# Patient Record
Sex: Female | Born: 1965 | Race: White | Hispanic: No | Marital: Single | Smoking: Never smoker
Health system: Southern US, Community
[De-identification: ages and names within clinical notes are randomized; demographics above are authoritative.]

## PROBLEM LIST (undated history)

## (undated) DIAGNOSIS — Z87442 Personal history of urinary calculi: Secondary | ICD-10-CM

## (undated) DIAGNOSIS — R29898 Other symptoms and signs involving the musculoskeletal system: Secondary | ICD-10-CM

## (undated) DIAGNOSIS — N289 Disorder of kidney and ureter, unspecified: Secondary | ICD-10-CM

## (undated) HISTORY — PX: OTHER SURGICAL HISTORY: SHX169

## (undated) HISTORY — PX: CYSTOSCOPY/RETROGRADE/URETEROSCOPY/STONE EXTRACTION WITH BASKET: SHX5317

---

## 1998-05-10 ENCOUNTER — Other Ambulatory Visit: Admission: RE | Admit: 1998-05-10 | Discharge: 1998-05-10 | Payer: Self-pay | Admitting: Obstetrics and Gynecology

## 1999-06-20 ENCOUNTER — Other Ambulatory Visit: Admission: RE | Admit: 1999-06-20 | Discharge: 1999-06-20 | Payer: Self-pay | Admitting: Obstetrics and Gynecology

## 2000-05-21 ENCOUNTER — Inpatient Hospital Stay (HOSPITAL_COMMUNITY): Admission: AD | Admit: 2000-05-21 | Discharge: 2000-05-21 | Payer: Self-pay | Admitting: Obstetrics and Gynecology

## 2000-05-24 ENCOUNTER — Inpatient Hospital Stay (HOSPITAL_COMMUNITY): Admission: AD | Admit: 2000-05-24 | Discharge: 2000-05-24 | Payer: Self-pay | Admitting: Obstetrics & Gynecology

## 2000-05-25 ENCOUNTER — Inpatient Hospital Stay (HOSPITAL_COMMUNITY): Admission: AD | Admit: 2000-05-25 | Discharge: 2000-05-27 | Payer: Self-pay | Admitting: Obstetrics and Gynecology

## 2000-06-25 ENCOUNTER — Other Ambulatory Visit: Admission: RE | Admit: 2000-06-25 | Discharge: 2000-06-25 | Payer: Self-pay | Admitting: Obstetrics and Gynecology

## 2001-09-05 ENCOUNTER — Other Ambulatory Visit: Admission: RE | Admit: 2001-09-05 | Discharge: 2001-09-05 | Payer: Self-pay | Admitting: Obstetrics and Gynecology

## 2002-09-10 ENCOUNTER — Other Ambulatory Visit: Admission: RE | Admit: 2002-09-10 | Discharge: 2002-09-10 | Payer: Self-pay | Admitting: Obstetrics and Gynecology

## 2003-10-20 ENCOUNTER — Other Ambulatory Visit: Admission: RE | Admit: 2003-10-20 | Discharge: 2003-10-20 | Payer: Self-pay | Admitting: Obstetrics and Gynecology

## 2004-11-09 ENCOUNTER — Other Ambulatory Visit: Admission: RE | Admit: 2004-11-09 | Discharge: 2004-11-09 | Payer: Self-pay | Admitting: Obstetrics and Gynecology

## 2006-02-15 ENCOUNTER — Ambulatory Visit (HOSPITAL_COMMUNITY): Admission: RE | Admit: 2006-02-15 | Discharge: 2006-02-15 | Payer: Self-pay | Admitting: Obstetrics and Gynecology

## 2007-02-18 ENCOUNTER — Ambulatory Visit (HOSPITAL_COMMUNITY): Admission: RE | Admit: 2007-02-18 | Discharge: 2007-02-18 | Payer: Self-pay | Admitting: Obstetrics and Gynecology

## 2008-02-19 ENCOUNTER — Ambulatory Visit (HOSPITAL_COMMUNITY): Admission: RE | Admit: 2008-02-19 | Discharge: 2008-02-19 | Payer: Self-pay | Admitting: Obstetrics and Gynecology

## 2008-04-10 ENCOUNTER — Encounter (INDEPENDENT_AMBULATORY_CARE_PROVIDER_SITE_OTHER): Payer: Self-pay | Admitting: Obstetrics and Gynecology

## 2008-04-10 ENCOUNTER — Ambulatory Visit (HOSPITAL_COMMUNITY): Admission: RE | Admit: 2008-04-10 | Discharge: 2008-04-10 | Payer: Self-pay | Admitting: Obstetrics and Gynecology

## 2008-11-07 ENCOUNTER — Emergency Department (HOSPITAL_COMMUNITY): Admission: EM | Admit: 2008-11-07 | Discharge: 2008-11-07 | Payer: Self-pay | Admitting: Emergency Medicine

## 2008-11-17 ENCOUNTER — Ambulatory Visit (HOSPITAL_COMMUNITY): Admission: RE | Admit: 2008-11-17 | Discharge: 2008-11-17 | Payer: Self-pay | Admitting: Urology

## 2009-03-08 ENCOUNTER — Ambulatory Visit (HOSPITAL_COMMUNITY): Admission: RE | Admit: 2009-03-08 | Discharge: 2009-03-08 | Payer: Self-pay | Admitting: Pediatrics

## 2009-06-07 ENCOUNTER — Ambulatory Visit (HOSPITAL_COMMUNITY): Admission: RE | Admit: 2009-06-07 | Discharge: 2009-06-07 | Payer: Self-pay | Admitting: Obstetrics and Gynecology

## 2009-07-12 ENCOUNTER — Ambulatory Visit (HOSPITAL_COMMUNITY)
Admission: RE | Admit: 2009-07-12 | Discharge: 2009-07-12 | Payer: Self-pay | Source: Home / Self Care | Admitting: Obstetrics and Gynecology

## 2010-01-31 ENCOUNTER — Ambulatory Visit (HOSPITAL_COMMUNITY)
Admission: RE | Admit: 2010-01-31 | Discharge: 2010-01-31 | Payer: Self-pay | Source: Home / Self Care | Attending: Pediatrics | Admitting: Pediatrics

## 2010-03-14 ENCOUNTER — Ambulatory Visit (HOSPITAL_COMMUNITY)
Admission: RE | Admit: 2010-03-14 | Discharge: 2010-03-14 | Payer: Self-pay | Source: Home / Self Care | Attending: Obstetrics and Gynecology | Admitting: Obstetrics and Gynecology

## 2010-04-11 ENCOUNTER — Other Ambulatory Visit: Payer: Self-pay | Admitting: Gastroenterology

## 2010-05-27 LAB — HCG, QUANTITATIVE, PREGNANCY: hCG, Beta Chain, Quant, S: <2 m[IU]/mL (ref <5)

## 2010-05-27 LAB — BASIC METABOLIC PANEL
BUN: 11 mg/dL (ref 6–23)
Chloride: 102 mEq/L (ref 96–112)
Potassium: 4.4 mEq/L (ref 3.5–5.1)

## 2010-05-27 LAB — HEMOGLOBIN AND HEMATOCRIT, BLOOD: Hemoglobin: 12.7 g/dL (ref 12.0–15.0)

## 2010-06-07 LAB — CBC
HCT: 37.3 % (ref 36.0–46.0)
Hemoglobin: 12.4 g/dL (ref 12.0–15.0)
MCHC: 33.3 g/dL (ref 30.0–36.0)
Platelets: 240 10*3/uL (ref 150–400)
RDW: 13.1 % (ref 11.5–15.5)

## 2010-06-19 ENCOUNTER — Emergency Department (HOSPITAL_COMMUNITY): Payer: BC Managed Care – PPO

## 2010-06-19 ENCOUNTER — Emergency Department (HOSPITAL_COMMUNITY)
Admission: EM | Admit: 2010-06-19 | Discharge: 2010-06-19 | Disposition: A | Discharge status: Home / Self Care | Payer: BC Managed Care – PPO | Attending: Emergency Medicine | Admitting: Emergency Medicine

## 2010-06-19 DIAGNOSIS — R109 Unspecified abdominal pain: Secondary | ICD-10-CM | POA: Insufficient documentation

## 2010-06-19 DIAGNOSIS — N83209 Unspecified ovarian cyst, unspecified side: Secondary | ICD-10-CM | POA: Insufficient documentation

## 2010-06-19 LAB — URINALYSIS, ROUTINE W REFLEX MICROSCOPIC
Glucose, UA: NEGATIVE mg/dL
Ketones, ur: 15 mg/dL — AB
Protein, ur: 30 mg/dL — AB
pH: 5.5 (ref 5.0–8.0)

## 2010-06-19 LAB — URINE MICROSCOPIC-ADD ON

## 2010-06-21 LAB — URINE CULTURE: Culture  Setup Time: 201204292050

## 2010-07-05 NOTE — H&P (Signed)
Tracy Serrano, Tracy Serrano              ACCOUNT NO.:  [id number]   MEDICAL RECORD NO.:  [id number]          PATIENT TYPE:  AMB   LOCATION:  SDC                           FACILITY:  [facility]   PHYSICIAN:  Juluis Mire, M.D.   DATE OF BIRTH:  1965-10-31   DATE OF ADMISSION:  04/10/2008  DATE OF DISCHARGE:                              HISTORY & PHYSICAL   The patient is a 45 year old gravida 4, para 3, aborts 1 female presents  for hysteroscopy.   In relation to present admission, the patient has a history of  menorrhagia while on birth control pills.  Subsequent saline-infused  ultrasound did reveal multiple fibroids, two of which were definitely  submucosal intracavitary measuring 2.4 x 1.8 cm.  She now presents for  hysteroscopic resection.   In terms of allergies, she has seasonal allergies.   Medications include birth control pills in the form of generic Lo/Ovral.  She uses Zyrtec as needed.   PAST MEDICAL HISTORY:  Usual childhood diseases.  No significant  sequelae.   PAST SURGICAL HISTORY:  She has had vaginal hematoma that was in incised  and drained in 1995.  Otherwise, no significant issues.   SURGICAL HISTORY:  She has had three vaginal deliveries and one  miscarriage.   SOCIAL HISTORY:  No tobacco or alcohol use.   FAMILY HISTORY:  Noncontributory.   REVIEW OF SYSTEMS:  Noncontributory.   PHYSICAL EXAMINATION:  VITAL SIGNS:  The patient is afebrile.  Stable  vital signs.  HEENT:  The patient is normocephalic.  Pupils are equal, round, and  reactive to light and accommodation.  Extraocular movements were intact.  Sclerae and conjunctivae are clear.  Oropharynx clear.  NECK:  Without thyromegaly.  BREASTS:  Not examined.  LUNGS:  Clear.  CARDIOVASCULAR:  Regular rate without murmurs or gallops.  ABDOMEN:  Benign.  No mass, organomegaly, or tenderness.  PELVIC:  Normal external genitalia.  Vaginal mucosa is clear.  Cervix  unremarkable.  Uterus upper limits of normal  size and irregular  consistent with known fibroids.  Adnexa unremarkable.  EXTREMITIES:  Trace edema.  NEUROLOGIC:  Grossly within normal limits.   IMPRESSION:  Menorrhagia, on pills with intracavitary fibroids.   PLAN:  The patient to undergo hysteroscopic evaluation with attempt to  resection of fibroids.  Risks have been discussed including the risk of  infection.  The risk of hemorrhage could require transfusion with the  risk of AIDS or hepatitis.  Excessive bleeding could require  hysterectomy.  There is a risk of perforation leading to injury to  adjacent organs requiring exploratory surgery.  Risk of deep venous  thrombosis and pulmonary embolus.  Rare complications include fluid  overload leading to pulmonary edema and/or hyponatremia.  The patient  does understand indications and risks.      Juluis Mire, M.D.  Electronically Signed     JSM/MEDQ  D:  04/10/2008  T:  04/10/2008  Job:  [id number]

## 2010-07-05 NOTE — Op Note (Signed)
Tracy Serrano, Tracy Serrano              ACCOUNT NO.:  [id number]   MEDICAL RECORD NO.:  [id number]          PATIENT TYPE:  AMB   LOCATION:  SDC                           FACILITY:  [facility]   PHYSICIAN:  Juluis Mire, M.D.   DATE OF BIRTH:  1965/11/10   DATE OF PROCEDURE:  04/10/2008  DATE OF DISCHARGE:                               OPERATIVE REPORT   PREOPERATIVE DIAGNOSIS:  Uterine fibroids.   POSTOPERATIVE DIAGNOSIS:  Uterine fibroids.   PROCEDURE:  Hysteroscopy, resection of submucosal fibroids.   SURGEON:  Juluis Mire, MD.   ANESTHESIA:  General.   ESTIMATED BLOOD LOSS:  Minimal.   PACKS AND DRAINS:  None.   INTRAOPERATIVE BLOOD PLACED:  None.   COMPLICATIONS:  None.   INDICATIONS:  As dictated in the history and physical.   PROCEDURE IN DETAIL:  The patient was taken to the OR and placed in  supine position.  After satisfactory level of general anesthesia was  obtained, the patient was placed in the dorsal lithotomy position using  Allen stirrups.  The perineum, vagina prepped out with Betadine and  draped sterile field.  A speculum was placed in the vaginal vault.  Cervix was grasped with single-tooth tenaculum.  A paracervical block  using 1% Nesacaine was instituted.  Uterus sounded to 10 cm.  Cervix was  dilated to a size 31 Pratt dilator.  Operative hysteroscopy was then  introduced.  Intrauterine cavity was distended using sorbitol.  Visualization revealed a intrauterine fibroid on the right pelvic  sidewall then posteriorly she had a submucosal fibroid.  Remaining  endometrial cavity appeared unremarkable.  First we resected out the  intrauterine fibroid to the right side.  Pieces removed using suction  curetting.  We then began resecting the posterior wall fibroid.  Again,  used suction curette to remove the pieces of the fibroid.  This was  continued until we were flush with the uterine cavity.  At this point,  it did not seem like the fibroid is protruding  out.  We are afraid to go  any deeper and our deficit was over 500 mL at this point in time.  We  had no active bleeding.  The hysteroscope was removed.  We did do intrauterine curettings.  The  single-tooth tenaculum and speculum then removed.  The patient taken out  in the dorsal lithotomy position, was alert, transferred to recovery in  good condition.  Sponge, instrument, and needle count was correct by  circulating nurse.      Juluis Mire, M.D.  Electronically Signed     JSM/MEDQ  D:  04/10/2008  T:  04/10/2008  Job:  [id number]

## 2010-11-26 IMAGING — CT CT ABDOMEN W/O CM
4 of 5 series · 12 of 46 positions shown, 17 images · non-contrast
Comparison: None

CT ABDOMEN

CLINICAL DATA: Left flank pain; evaluate for obstructing stone.

CT ABDOMEN AND PELVIS WITHOUT CONTRAST
TECHNIQUE: Multidetector CT imaging of the abdomen and pelvis was
performed using the standard protocol without use of intravenous
contrast.

[Series 3: lung 5.0 b60f · axial · 0.71mm/px · z∈[-188,-153]mm · 2 of 40 slices shown]
[im 7/40  bone]
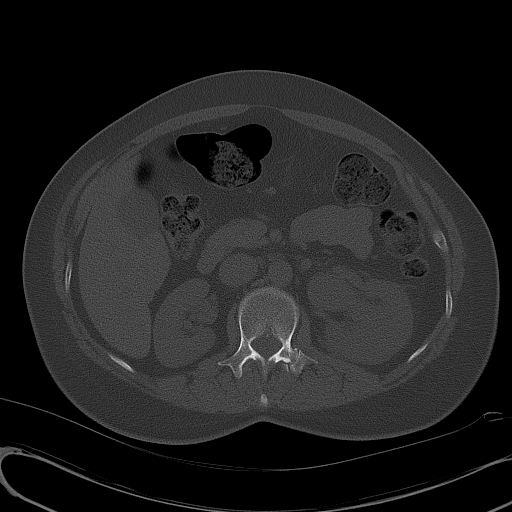
[im 14/40  bone]
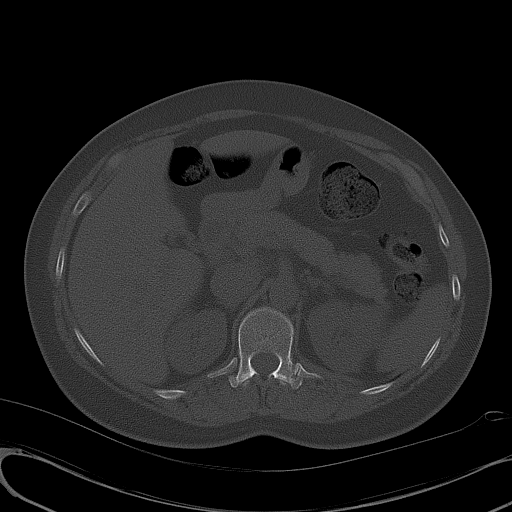

[Series 4: mpr coronal (id) · coronal · 0.77mm/px · 3 of 88 slices shown]
[im 30/88  soft-tissue]
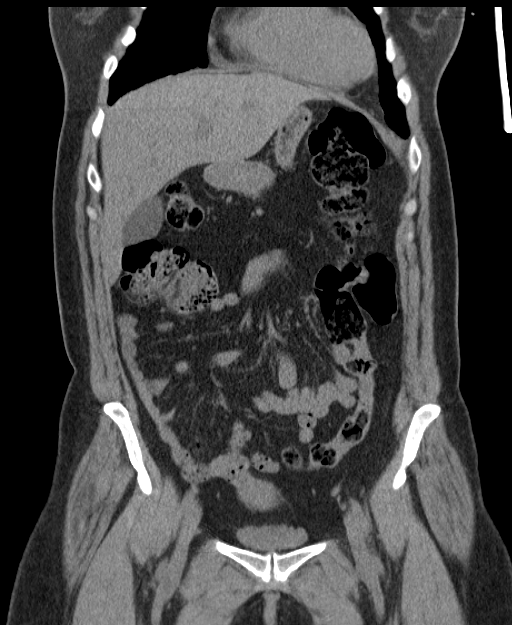
[im 39/88  soft-tissue]
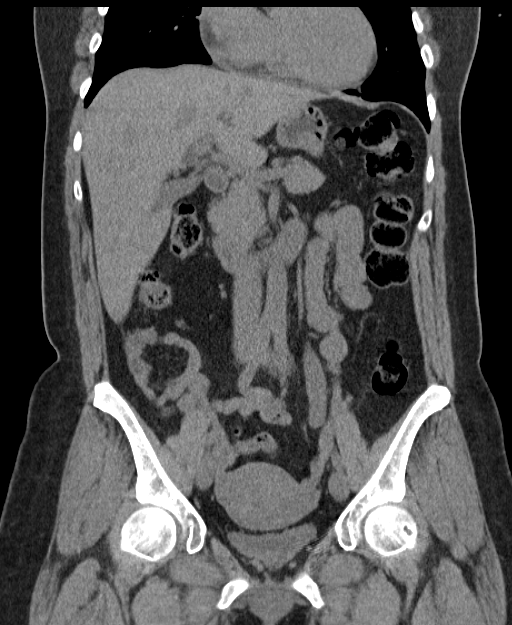
[im 49/88  soft-tissue]
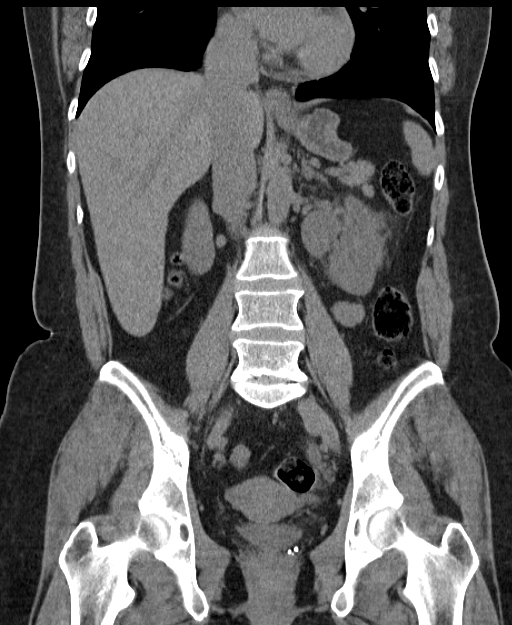

[Series 5: mpr sagittal (id) · sagittal · 0.63mm/px · 1 of 137 slices shown]
[im 46/137  soft-tissue]
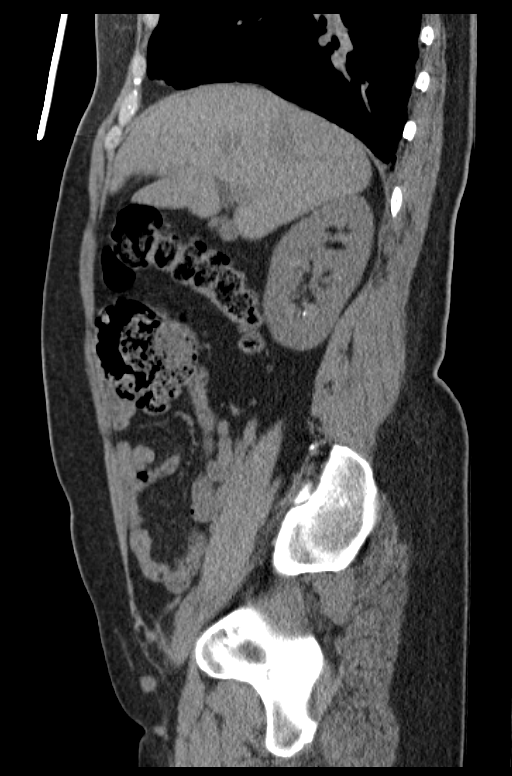

[Series 101: tmp stone rtd · axial · 0.71mm/px · z∈[-438,-93]mm · 6 of 42 slices shown, 11 images]
[im 6/42  soft-tissue]
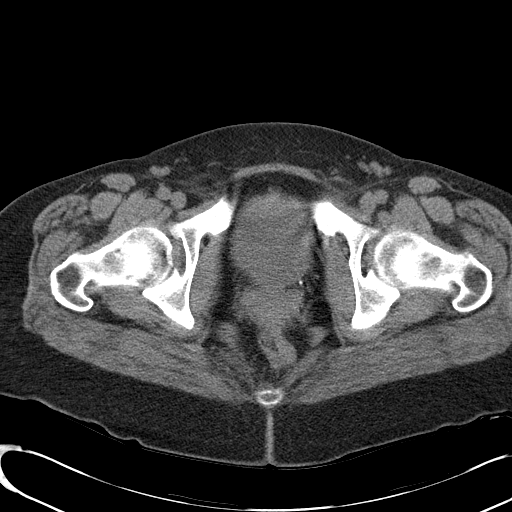
[im 6/42  bone]
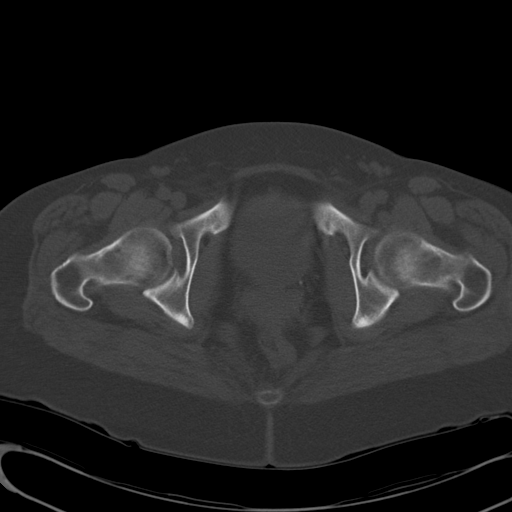
[im 12/42  soft-tissue]
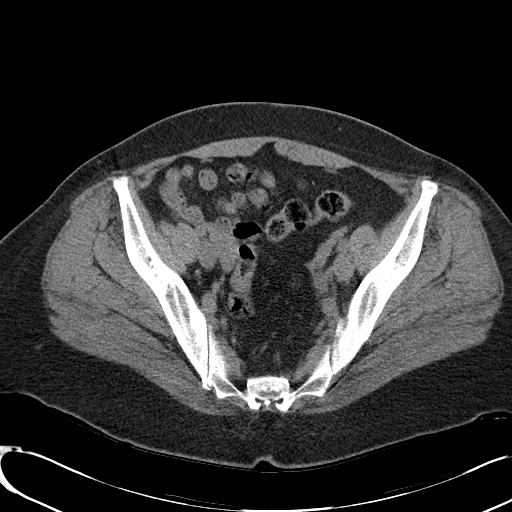
[im 18/42  soft-tissue]
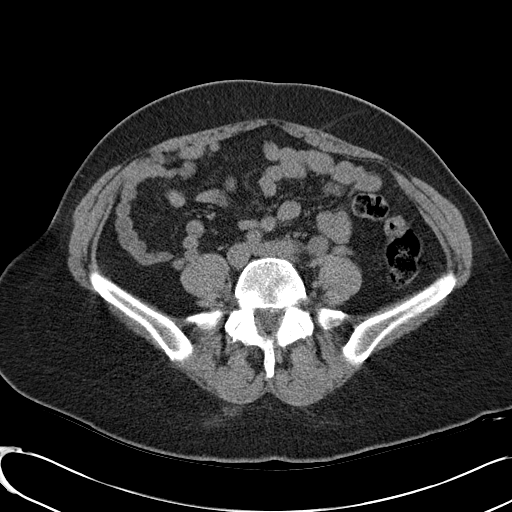
[im 18/42  lung]
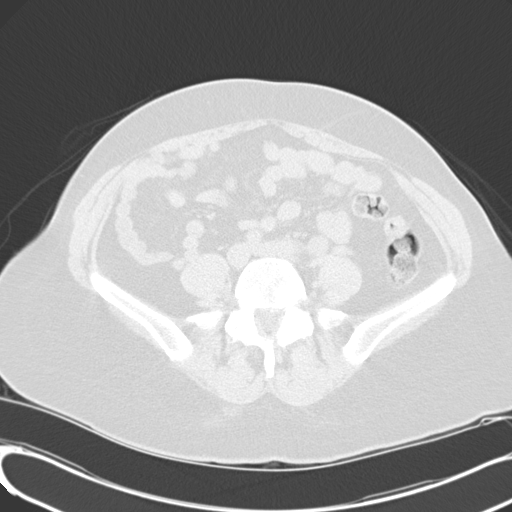
[im 24/42  soft-tissue]
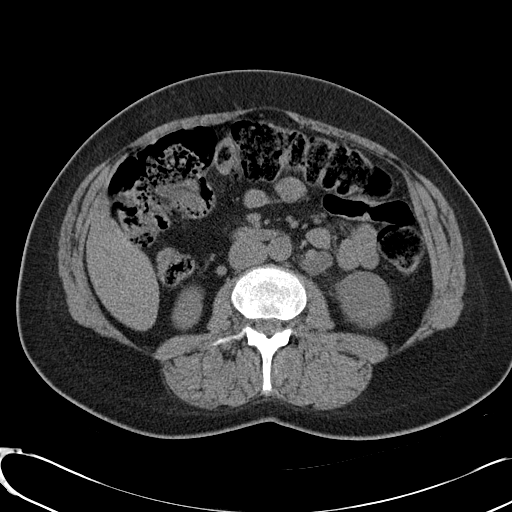
[im 24/42  lung]
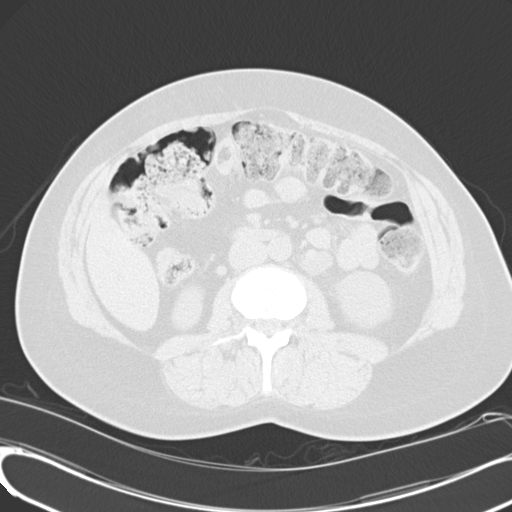
[im 30/42  soft-tissue]
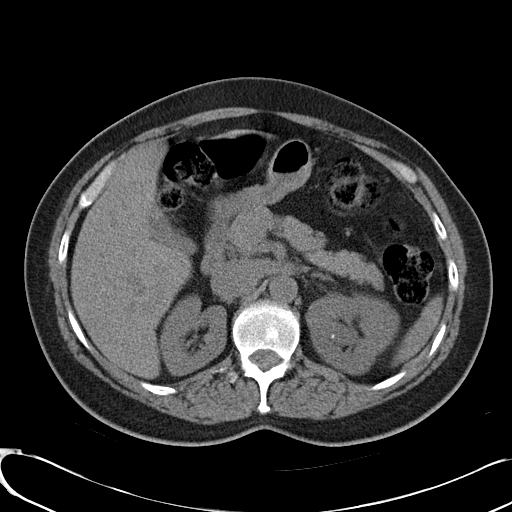
[im 30/42  lung]
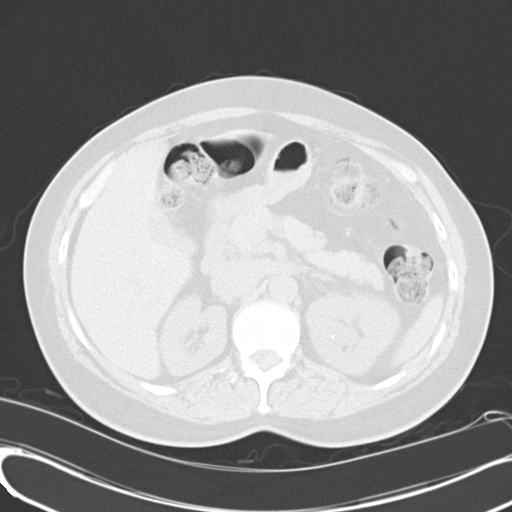
[im 36/42  soft-tissue]
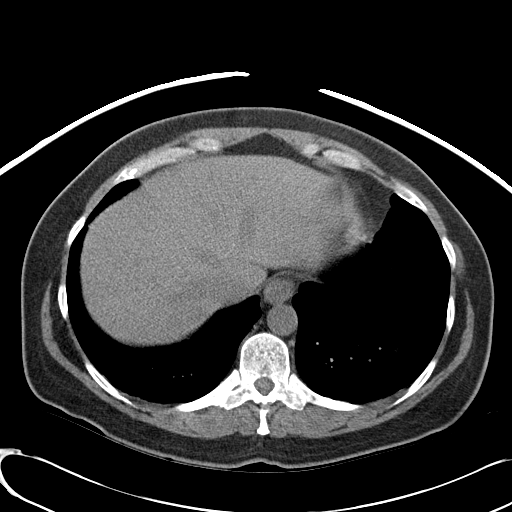
[im 36/42  lung]
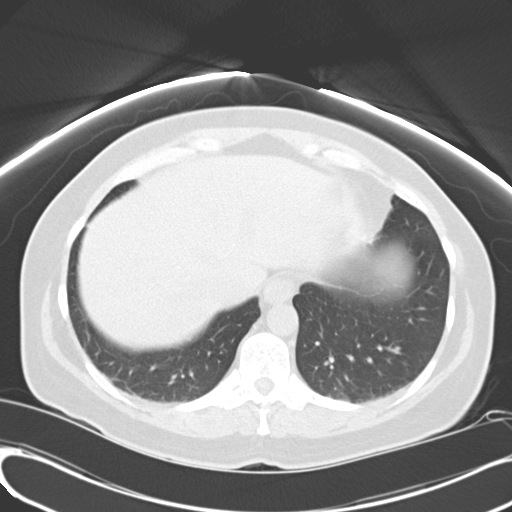

[12 of 46 positions shown; findings below may reference images not displayed]

FINDINGS: Minimal bibasilar atelectasis is noted.

The liver and spleen are unremarkable in appearance.  The
gallbladder is within normal limits.  The pancreas and adrenal
glands are unremarkable.

There is moderate left-sided hydronephrosis, with significant
enlargement of the left ureter to the level of an obstructing 7 x 4
mm stone within the distal left ureter, approximately 5 cm above
the left vesicoureteral junction.  Mild left-sided perinephric
stranding is noted.

Three additional small nonobstructing stones are noted within the
left kidney, the largest of which measures 3 mm in size.  There is
also a 4 mm nonobstructing stone identified at the inferior pole of
the right kidney.  The right kidney is otherwise unremarkable in
appearance.

No free fluid is identified.  The small bowel is unremarkable in
appearance.  The stomach is within normal limits.  No acute
vascular abnormalities are seen.

No acute osseous abnormalities are identified. There is incomplete
fusion of the transverse processes of L1, and incomplete fusion of
the left transverse process of L2.
IMPRESSION: 1.  Moderate left-sided hydroureteronephrosis, to the level of a 7
x 4 mm obstructing stone in the distal left ureter, approximately 5
cm above the left vesicoureteral junction.
2.  Additional nonobstructing stones noted within both kidneys.

CT PELVIS
FINDINGS: No free fluid is identified within the pelvis.  There is
a relatively mobile cecum, which is seen flipped anteriorly
adjacent to the inferior tip of the liver.  The colon is otherwise
unremarkable in appearance.  The appendix is not definitely seen.

The bladder is largely decompressed and within normal limits. The
uterus is unremarkable in appearance.  The adnexa are within normal
limits.  Scattered vascular calcifications are seen about the
cervix.  There is no evidence of inguinal lymphadenopathy.

No acute osseous abnormalities are seen. A small herniation pit is
noted at the anterior left femoral neck.
IMPRESSION: No acute abnormalities identified within the pelvis; relatively
mobile cecum noted.

## 2011-02-17 ENCOUNTER — Other Ambulatory Visit (HOSPITAL_COMMUNITY): Payer: Self-pay | Admitting: Obstetrics and Gynecology

## 2011-02-17 DIAGNOSIS — Z139 Encounter for screening, unspecified: Secondary | ICD-10-CM

## 2011-03-20 ENCOUNTER — Ambulatory Visit (HOSPITAL_COMMUNITY)
Admission: RE | Admit: 2011-03-20 | Discharge: 2011-03-20 | Disposition: A | Discharge status: Home / Self Care | Payer: BC Managed Care – PPO | Source: Ambulatory Visit | Attending: Obstetrics and Gynecology | Admitting: Obstetrics and Gynecology

## 2011-03-20 DIAGNOSIS — Z139 Encounter for screening, unspecified: Secondary | ICD-10-CM

## 2011-03-20 DIAGNOSIS — Z1231 Encounter for screening mammogram for malignant neoplasm of breast: Secondary | ICD-10-CM | POA: Insufficient documentation

## 2012-03-05 ENCOUNTER — Other Ambulatory Visit (HOSPITAL_COMMUNITY): Payer: Self-pay | Admitting: Obstetrics and Gynecology

## 2012-03-05 DIAGNOSIS — IMO0001 Reserved for inherently not codable concepts without codable children: Secondary | ICD-10-CM

## 2012-03-25 ENCOUNTER — Ambulatory Visit (HOSPITAL_COMMUNITY)
Admission: RE | Admit: 2012-03-25 | Discharge: 2012-03-25 | Disposition: A | Discharge status: Home / Self Care | Payer: BC Managed Care – PPO | Source: Ambulatory Visit | Attending: Obstetrics and Gynecology | Admitting: Obstetrics and Gynecology

## 2012-03-25 DIAGNOSIS — Z1231 Encounter for screening mammogram for malignant neoplasm of breast: Secondary | ICD-10-CM | POA: Insufficient documentation

## 2012-03-25 DIAGNOSIS — IMO0001 Reserved for inherently not codable concepts without codable children: Secondary | ICD-10-CM

## 2013-03-10 ENCOUNTER — Other Ambulatory Visit (HOSPITAL_COMMUNITY): Payer: Self-pay | Admitting: Obstetrics and Gynecology

## 2013-03-10 DIAGNOSIS — Z139 Encounter for screening, unspecified: Secondary | ICD-10-CM

## 2013-03-27 ENCOUNTER — Encounter (HOSPITAL_COMMUNITY): Payer: Self-pay | Admitting: Emergency Medicine

## 2013-03-27 ENCOUNTER — Emergency Department (HOSPITAL_COMMUNITY)
Admission: EM | Admit: 2013-03-27 | Discharge: 2013-03-27 | Disposition: A | Discharge status: Home / Self Care | Payer: BC Managed Care – PPO | Attending: Emergency Medicine | Admitting: Emergency Medicine

## 2013-03-27 DIAGNOSIS — X503XXA Overexertion from repetitive movements, initial encounter: Secondary | ICD-10-CM | POA: Insufficient documentation

## 2013-03-27 DIAGNOSIS — S43499A Other sprain of unspecified shoulder joint, initial encounter: Secondary | ICD-10-CM | POA: Insufficient documentation

## 2013-03-27 DIAGNOSIS — M79602 Pain in left arm: Secondary | ICD-10-CM

## 2013-03-27 DIAGNOSIS — Z87442 Personal history of urinary calculi: Secondary | ICD-10-CM | POA: Insufficient documentation

## 2013-03-27 DIAGNOSIS — Y929 Unspecified place or not applicable: Secondary | ICD-10-CM | POA: Insufficient documentation

## 2013-03-27 DIAGNOSIS — S46819A Strain of other muscles, fascia and tendons at shoulder and upper arm level, unspecified arm, initial encounter: Principal | ICD-10-CM

## 2013-03-27 DIAGNOSIS — Y9389 Activity, other specified: Secondary | ICD-10-CM | POA: Insufficient documentation

## 2013-03-27 DIAGNOSIS — T148XXA Other injury of unspecified body region, initial encounter: Secondary | ICD-10-CM

## 2013-03-27 HISTORY — DX: Disorder of kidney and ureter, unspecified: N28.9

## 2013-03-27 MED ORDER — CYCLOBENZAPRINE HCL 10 MG PO TABS: 10.0000 mg | ORAL_TABLET | Freq: Two times a day (BID) | ORAL | Status: DC | PRN

## 2013-03-27 MED ORDER — TRAMADOL HCL 50 MG PO TABS: 50.0000 mg | ORAL_TABLET | Freq: Four times a day (QID) | ORAL | Status: DC | PRN

## 2013-03-27 NOTE — ED Notes (Signed)
Pt c/o pain to left arm. Pain began after carrying wood ~ 54200ft. Pt states pain is much worse with movement.

## 2013-03-27 NOTE — ED Provider Notes (Signed)
Medical screening examination/treatment/procedure(s) were performed by non-physician practitioner and as supervising physician I was immediately available for consultation/collaboration.  EKG Interpretation   None         Desirey Keahey W Shiela Bruns, MD 03/27/13 1644 

## 2013-03-27 NOTE — ED Provider Notes (Signed)
CSN: [id number]     Arrival date & time 03/27/13  0915 History   First MD Initiated Contact with Patient 03/27/13 1000     Chief Complaint  Patient presents with  . Arm Injury   (Consider location/radiation/quality/duration/timing/severity/associated sxs/prior Treatment) HPI Comments: Patient is a 48 year old female who presents to the emergency department complaining of left arm pain x5 days. Patient states she was carrying wood about 500 feet, the next day she began to feel pain in her left deltoid radiating down her arm. Pain worse with movement. She tried taking hydrocodone, codeine and tramadol which her husband that with some relief. Denies numbness.  Patient is a 48 y.o. female presenting with arm injury. The history is provided by the patient.  Arm Injury Associated symptoms: no fever     Past Medical History  Diagnosis Date  . Renal disorder     kideny stones   Past Surgical History  Procedure Laterality Date  . Basket recovery     No family history on file. History  Substance Use Topics  . Smoking status: Never Smoker   . Smokeless tobacco: Not on file  . Alcohol Use: No   OB History   Grav Para Term Preterm Abortions TAB SAB Ect Mult Living                 Review of Systems  Constitutional: Negative for fever.  Respiratory: Negative.   Cardiovascular: Negative.   Gastrointestinal: Negative for nausea.  Musculoskeletal:       Positive for left arm pain.  Neurological: Negative for numbness.    Allergies  Review of patient's allergies indicates no known allergies.  Home Medications   Current Outpatient Rx  Name  Route  Sig  Dispense  Refill  . cyclobenzaprine (FLEXERIL) 10 MG tablet   Oral   Take 1 tablet (10 mg total) by mouth 2 (two) times daily as needed for muscle spasms.   20 tablet   0   . traMADol (ULTRAM) 50 MG tablet   Oral   Take 1 tablet (50 mg total) by mouth every 6 (six) hours as needed.   15 tablet   0    BP 122/79  Pulse 70   Temp(Src) 98.5 F (36.9 C) (Oral)  Resp 16  Ht 5\' 7"  (1.702 m)  Wt 156 lb (70.761 kg)  BMI 24.43 kg/m2  SpO2 100%  LMP 03/11/2013 Physical Exam  Nursing note and vitals reviewed. Constitutional: She is oriented to person, place, and time. She appears well-developed and well-nourished. No distress.  HENT:  Head: Normocephalic and atraumatic.  Mouth/Throat: Oropharynx is clear and moist.  Eyes: Conjunctivae are normal.  Neck: Normal range of motion. Neck supple. No spinous process tenderness present.  Cardiovascular: Normal rate, regular rhythm, normal heart sounds and intact distal pulses.   Pulmonary/Chest: Effort normal and breath sounds normal.  Musculoskeletal: Normal range of motion. She exhibits no edema.  Tender to palpation of the left deltoid and trapezius with spasm. Full range of motion of left shoulder and arm. Pain noted with left shoulder flexion and abduction. No bony tenderness.  Neurological: She is alert and oriented to person, place, and time. She has normal strength. No sensory deficit.  Skin: Skin is warm and dry. She is not diaphoretic.  Psychiatric: She has a normal mood and affect. Her behavior is normal.    ED Course  Procedures (including critical care time) Labs Review Labs Reviewed - No data to display Imaging Review No results  found.  EKG Interpretation   None       MDM   1. Muscle strain   2. Arm pain, left    Patient presenting with left arm pain after heavy lifting. She is well appearing and in no apparent distress with normal vital signs. No bony tenderness. Treat with Flexeril and tramadol. Advised heating pad. Stable for discharge. Return precautions given. Patient states understanding of treatment care plan and is agreeable.   Trevor Mace, PA-C 03/27/13 1026

## 2013-03-27 NOTE — Discharge Instructions (Signed)
Take Flexeril as directed for muscle spasm. No driving or operating heavy machinery with taking this drug as it may cause drowsiness. Take tramadol as directed. Rest, avoid heavy lifting or hard physical activity. Apply heating pad intermittently throughout the day.  Muscle Strain A muscle strain is an injury that occurs when a muscle is stretched beyond its normal length. Usually a small number of muscle fibers are torn when this happens. Muscle strain is rated in degrees. First-degree strains have the least amount of muscle fiber tearing and pain. Second-degree and third-degree strains have increasingly more tearing and pain.  Usually, recovery from muscle strain takes 1 2 weeks. Complete healing takes 5 6 weeks.  CAUSES  Muscle strain happens when a sudden, violent force placed on a muscle stretches it too far. This may occur with lifting, sports, or a fall.  RISK FACTORS Muscle strain is especially common in athletes.  SIGNS AND SYMPTOMS At the site of the muscle strain, there may be:  Pain.  Bruising.  Swelling.  Difficulty using the muscle due to pain or lack of normal function. DIAGNOSIS  Your health care provider will perform a physical exam and ask about your medical history. TREATMENT  Often, the best treatment for a muscle strain is resting, icing, and applying cold compresses to the injured area.  HOME CARE INSTRUCTIONS   Use the PRICE method of treatment to promote muscle healing during the first 2 3 days after your injury. The PRICE method involves:  Protecting the muscle from being injured again.  Restricting your activity and resting the injured body part.  Icing your injury. To do this, put ice in a plastic bag. Place a towel between your skin and the bag. Then, apply the ice and leave it on from 15 20 minutes each hour. After the third day, switch to moist heat packs.  Apply compression to the injured area with a splint or elastic bandage. Be careful not to wrap it  too tightly. This may interfere with blood circulation or increase swelling.  Elevate the injured body part above the level of your heart as often as you can.  Only take over-the-counter or prescription medicines for pain, discomfort, or fever as directed by your health care provider.  Warming up prior to exercise helps to prevent future muscle strains. SEEK MEDICAL CARE IF:   You have increasing pain or swelling in the injured area.  You have numbness, tingling, or a significant loss of strength in the injured area. MAKE SURE YOU:   Understand these instructions.  Will watch your condition.  Will get help right away if you are not doing well or get worse. Document Released: 02/06/2005 Document Revised: 11/27/2012 Document Reviewed: 09/05/2012 [facility] Patient Information 2014 [employer], [place].

## 2013-03-30 ENCOUNTER — Encounter (HOSPITAL_COMMUNITY): Payer: Self-pay | Admitting: Emergency Medicine

## 2013-03-30 ENCOUNTER — Emergency Department (HOSPITAL_COMMUNITY)
Admission: EM | Admit: 2013-03-30 | Discharge: 2013-03-30 | Disposition: A | Discharge status: Home / Self Care | Payer: BC Managed Care – PPO | Attending: Emergency Medicine | Admitting: Emergency Medicine

## 2013-03-30 ENCOUNTER — Emergency Department (HOSPITAL_COMMUNITY): Payer: BC Managed Care – PPO

## 2013-03-30 DIAGNOSIS — Z87442 Personal history of urinary calculi: Secondary | ICD-10-CM | POA: Insufficient documentation

## 2013-03-30 DIAGNOSIS — R42 Dizziness and giddiness: Secondary | ICD-10-CM | POA: Insufficient documentation

## 2013-03-30 DIAGNOSIS — M5412 Radiculopathy, cervical region: Secondary | ICD-10-CM | POA: Insufficient documentation

## 2013-03-30 DIAGNOSIS — G8911 Acute pain due to trauma: Secondary | ICD-10-CM | POA: Insufficient documentation

## 2013-03-30 LAB — URINALYSIS, ROUTINE W REFLEX MICROSCOPIC
BILIRUBIN URINE: NEGATIVE
Glucose, UA: NEGATIVE mg/dL
HGB URINE DIPSTICK: NEGATIVE
Ketones, ur: 15 mg/dL — AB
Leukocytes, UA: NEGATIVE
Nitrite: NEGATIVE
Protein, ur: NEGATIVE mg/dL
SPECIFIC GRAVITY, URINE: 1.015 (ref 1.005–1.030)
Urobilinogen, UA: 0.2 mg/dL (ref 0.0–1.0)
pH: 8.5 — ABNORMAL HIGH (ref 5.0–8.0)

## 2013-03-30 MED ORDER — KETOROLAC TROMETHAMINE 60 MG/2ML IM SOLN
60.0000 mg | Freq: Once | INTRAMUSCULAR | Status: AC
  Administered 2013-03-30: 60 mg via INTRAMUSCULAR
  Filled 2013-03-30: qty 2

## 2013-03-30 MED ORDER — DIAZEPAM 2 MG PO TABS
2.0000 mg | ORAL_TABLET | Freq: Once | ORAL | Status: AC
  Administered 2013-03-30: 2 mg via ORAL
  Filled 2013-03-30: qty 1

## 2013-03-30 MED ORDER — HYDROCODONE-ACETAMINOPHEN 5-325 MG PO TABS
2.0000 | ORAL_TABLET | ORAL | Status: DC | PRN
Start: 2013-03-30 — End: 2015-07-22

## 2013-03-30 MED ORDER — METHYLPREDNISOLONE (PAK) 4 MG PO TABS: ORAL_TABLET | ORAL | Status: DC

## 2013-03-30 NOTE — Discharge Instructions (Signed)
Cervical Radiculopathy  Cervical radiculopathy happens when a nerve in the neck is pinched or bruised by a slipped (herniated) disk or by arthritic changes in the bones of the cervical spine. This can occur due to an injury or as part of the normal aging process. Pressure on the cervical nerves can cause pain or numbness that runs from your neck all the way down into your arm and fingers.  CAUSES   There are many possible causes, including:  · Injury.  · Muscle tightness in the neck from overuse.  · Swollen, painful joints (arthritis).  · Breakdown or degeneration in the bones and joints of the spine (spondylosis) due to aging.  · Bone spurs that may develop near the cervical nerves.  SYMPTOMS   Symptoms include pain, weakness, or numbness in the affected arm and hand. Pain can be severe or irritating. Symptoms may be worse when extending or turning the neck.  DIAGNOSIS   Your caregiver will ask about your symptoms and do a physical exam. He or she may test your strength and reflexes. X-rays, CT scans, and MRI scans may be needed in cases of injury or if the symptoms do not go away after a period of time. Electromyography (EMG) or nerve conduction testing may be done to study how your nerves and muscles are working.  TREATMENT   Your caregiver may recommend certain exercises to help relieve your symptoms. Cervical radiculopathy can, and often does, get better with time and treatment. If your problems continue, treatment options may include:  · Wearing a soft collar for short periods of time.  · Physical therapy to strengthen the neck muscles.  · Medicines, such as nonsteroidal anti-inflammatory drugs (NSAIDs), oral corticosteroids, or spinal injections.  · Surgery. Different types of surgery may be done depending on the cause of your problems.  HOME CARE INSTRUCTIONS   · Put ice on the affected area.  · Put ice in a plastic bag.  · Place a towel between your skin and the bag.  · Leave the ice on for 15-20 minutes,  03-04 times a day or as directed by your caregiver.  · If ice does not help, you can try using heat. Take a warm shower or bath, or use a hot water bottle as directed by your caregiver.  · You may try a gentle neck and shoulder massage.  · Use a flat pillow when you sleep.  · Only take over-the-counter or prescription medicines for pain, discomfort, or fever as directed by your caregiver.  · If physical therapy was prescribed, follow your caregiver's directions.  · If a soft collar was prescribed, use it as directed.  SEEK IMMEDIATE MEDICAL CARE IF:   · Your pain gets much worse and cannot be controlled with medicines.  · You have weakness or numbness in your hand, arm, face, or leg.  · You have a high fever or a stiff, rigid neck.  · You lose bowel or bladder control (incontinence).  · You have trouble with walking, balance, or speaking.  MAKE SURE YOU:   · Understand these instructions.  · Will watch your condition.  · Will get help right away if you are not doing well or get worse.  Document Released: 11/01/2000 Document Revised: 05/01/2011 Document Reviewed: 09/20/2010  [employer]® Patient Information ©2014 [employer].

## 2013-03-30 NOTE — ED Provider Notes (Signed)
CSN: [id number]     Arrival date & time 03/30/13  1652 History  This chart was scribed for Glynn Octave, MD by Dorothey Baseman, ED Scribe. This patient was seen in room APA16A/APA16A and the patient's care was started at 5:25 PM.    Chief Complaint  Patient presents with  . Arm Pain   The history is provided by the patient. No language interpreter was used.   HPI Comments: Tracy Serrano is a 48 y.o. female who presents to the Emergency Department complaining of a constant pain, described as "spasms," to the left arm, extending from the shoulder to the mid-forearm, onset about a week ago after some heavy lifting. She states that the pain is exacerbated with movement. Patient was seen here 3 days ago for similar complaints and was discharged with Flexeril and Tramadol, which she states has only been able to provide mild, temporary relief. Patient also reports some dizziness and lightheadedness upon standing and that the dizziness resolved, but that the lightheadedness has been persistent. She reports some associated numbness and paresthesias to all fingers of the left hand, which she states is new for her. She reports applying warm compresses and keeping the area elevated at home without relief. She denies chest pain, shortness of breath. Patient has a history of renal disorder.   Past Medical History  Diagnosis Date  . Renal disorder     kideny stones   Past Surgical History  Procedure Laterality Date  . Basket recovery     No family history on file. History  Substance Use Topics  . Smoking status: Never Smoker   . Smokeless tobacco: Not on file  . Alcohol Use: No   OB History   Grav Para Term Preterm Abortions TAB SAB Ect Mult Living                 Review of Systems  A complete 10 system review of systems was obtained and all systems are negative except as noted in the HPI and PMH.   Allergies  Review of patient's allergies indicates no known allergies.  Home Medications    Current Outpatient Rx  Name  Route  Sig  Dispense  Refill  . cyclobenzaprine (FLEXERIL) 10 MG tablet   Oral   Take 1 tablet (10 mg total) by mouth 2 (two) times daily as needed for muscle spasms.   20 tablet   0   . traMADol (ULTRAM) 50 MG tablet   Oral   Take 50 mg by mouth every 6 (six) hours as needed. pain         . HYDROcodone-acetaminophen (NORCO/VICODIN) 5-325 MG per tablet   Oral   Take 2 tablets by mouth every 4 (four) hours as needed.   10 tablet   0   . methylPREDNIsolone (MEDROL DOSPACK) 4 MG tablet      follow package directions   21 tablet   0    Triage Vitals: BP 113/68  Pulse 81  Temp(Src) 97.9 F (36.6 C) (Oral)  Resp 20  Ht 5\' 7"  (1.702 m)  Wt 156 lb (70.761 kg)  BMI 24.43 kg/m2  SpO2 100%  LMP 03/11/2013  Physical Exam  Nursing note and vitals reviewed. Constitutional: She is oriented to person, place, and time. She appears well-developed and well-nourished. No distress.  HENT:  Head: Normocephalic and atraumatic.  Eyes: Conjunctivae are normal.  Neck: Normal range of motion. Neck supple.  Cardiovascular: Normal rate, regular rhythm, normal heart sounds and intact distal  pulses.   Pulses:      Radial pulses are 2+ on the right side, and 2+ on the left side.  Pulmonary/Chest: Effort normal and breath sounds normal. No respiratory distress.  Abdominal: She exhibits no distension.  Musculoskeletal: Normal range of motion. She exhibits tenderness.  Tenderness to palpation to the left upper trapezius that radiates into the posterior shoulder. Full range of motion of the shoulder, elbow, and wrist.   Neurological: She is alert and oriented to person, place, and time.  Distal sensation of all fingers of the left hand is intact. Equal grip strength bilaterally.   Skin: Skin is warm and dry.  Psychiatric: She has a normal mood and affect. Her behavior is normal.    ED Course  Procedures (including critical care time)  DIAGNOSTIC  STUDIES: Oxygen Saturation is 100% on room air, normal by my interpretation.    COORDINATION OF CARE: 5:38 PM- Will order an x-ray of the C spine, an EKG, and UA. Will order Toradol and Valium to manage symptoms. Discussed treatment plan with patient at bedside and patient verbalized agreement.   6:50 PM- Patient reports feeling much better after receiving the medications, but that the paresthesias have persisted. Discussed that x-ray results were normal and that symptoms are likely muscular in nature. Will discharge patient with pain medication to manage symptoms. Advised patient to continue applying heat and perform gentle stretching exercises at home. Discussed treatment plan with patient at bedside and patient verbalized agreement.    Labs Review Labs Reviewed  URINALYSIS, ROUTINE W REFLEX MICROSCOPIC - Abnormal; Notable for the following:    APPearance CLOUDY (*)    pH 8.5 (*)    Ketones, ur 15 (*)    All other components within normal limits   Imaging Review Dg Cervical Spine Complete  03/30/2013   CLINICAL DATA:  Continued left arm and shoulder pain with finger numbness for 1 week following lifting injury.  EXAM: CERVICAL SPINE  4+ VIEWS  COMPARISON:  None.  FINDINGS: The prevertebral soft tissues are normal. The alignment is anatomic through T1. There is no evidence of acute fracture or traumatic subluxation. The C1-2 articulation appears normal in the AP projection. The disc spaces are preserved. There is no osseous foraminal stenosis.  IMPRESSION: Negative cervical spine radiographs.   Electronically Signed   By: Roxy HorsemanBill  Veazey M.D.   On: 03/30/2013 18:34    EKG Interpretation    Date/Time:  Sunday March 30 2013 18:01:02 EST Ventricular Rate:  69 PR Interval:  146 QRS Duration: 84 QT Interval:  382 QTC Calculation: 409 R Axis:   54 Text Interpretation:  Normal sinus rhythm Normal ECG No previous ECGs available No previous ECGs available Confirmed by Mansa Willers  MD, Sandra Brents  ([id number]) on 03/30/2013 6:16:24 PM            MDM   1. Cervical radiculopathy    L arm pain since lifting injury one week ago.  Associated with tingling in fingertips.  No grip strength weakness.  No chest pain or SOB,  TTP L trapezius. FROM shoulder, elbow, hand, wrist. NV intact. C spine xray shows no significant stenosis.  Will prescribe steroids and pain medication.  ROM exercises discussed.  Followup with PCP this week.  I personally performed the services described in this documentation, which was scribed in my presence. The recorded information has been reviewed and is accurate.   Glynn OctaveStephen Mayco Walrond, MD 03/31/13 [phone]

## 2013-03-30 NOTE — ED Notes (Signed)
MD at bedside. 

## 2013-03-30 NOTE — ED Notes (Addendum)
Pt was seen in er on 02/05/215 for left arm pain after lifting wood a few days prior, states that the pain is worse and now she is having tingling in left arm, was trying to fix herself a sandwich earlier today when she became nauseous and "sweaty" and having lower back pain, has hx of kidney stones as well,

## 2013-03-31 ENCOUNTER — Ambulatory Visit (HOSPITAL_COMMUNITY): Payer: BC Managed Care – PPO

## 2013-04-07 ENCOUNTER — Ambulatory Visit (HOSPITAL_COMMUNITY)
Admission: RE | Admit: 2013-04-07 | Discharge: 2013-04-07 | Disposition: A | Discharge status: Home / Self Care | Payer: BC Managed Care – PPO | Source: Ambulatory Visit | Attending: Obstetrics and Gynecology | Admitting: Obstetrics and Gynecology

## 2013-04-07 ENCOUNTER — Ambulatory Visit (HOSPITAL_COMMUNITY): Payer: BC Managed Care – PPO

## 2013-04-07 DIAGNOSIS — Z1231 Encounter for screening mammogram for malignant neoplasm of breast: Secondary | ICD-10-CM | POA: Insufficient documentation

## 2013-04-07 DIAGNOSIS — Z139 Encounter for screening, unspecified: Secondary | ICD-10-CM

## 2014-03-10 ENCOUNTER — Other Ambulatory Visit: Payer: Self-pay | Admitting: Obstetrics and Gynecology

## 2014-03-10 DIAGNOSIS — Z1231 Encounter for screening mammogram for malignant neoplasm of breast: Secondary | ICD-10-CM

## 2014-04-13 ENCOUNTER — Ambulatory Visit (HOSPITAL_COMMUNITY)
Admission: RE | Admit: 2014-04-13 | Discharge: 2014-04-13 | Disposition: A | Discharge status: Home / Self Care | Payer: 59 | Source: Ambulatory Visit | Attending: Obstetrics and Gynecology | Admitting: Obstetrics and Gynecology

## 2014-04-13 DIAGNOSIS — Z1231 Encounter for screening mammogram for malignant neoplasm of breast: Secondary | ICD-10-CM | POA: Insufficient documentation

## 2014-08-12 ENCOUNTER — Other Ambulatory Visit: Payer: Self-pay | Admitting: Obstetrics and Gynecology

## 2014-08-14 LAB — CYTOLOGY - PAP

## 2015-04-13 ENCOUNTER — Other Ambulatory Visit (HOSPITAL_COMMUNITY): Payer: Self-pay | Admitting: Obstetrics and Gynecology

## 2015-04-13 DIAGNOSIS — Z1231 Encounter for screening mammogram for malignant neoplasm of breast: Secondary | ICD-10-CM

## 2015-05-03 ENCOUNTER — Ambulatory Visit (HOSPITAL_COMMUNITY)
Admission: RE | Admit: 2015-05-03 | Discharge: 2015-05-03 | Disposition: A | Discharge status: Home / Self Care | Payer: BC Managed Care – PPO | Source: Ambulatory Visit | Attending: Obstetrics and Gynecology | Admitting: Obstetrics and Gynecology

## 2015-05-03 DIAGNOSIS — Z1231 Encounter for screening mammogram for malignant neoplasm of breast: Secondary | ICD-10-CM | POA: Diagnosis not present

## 2015-07-22 ENCOUNTER — Emergency Department (HOSPITAL_COMMUNITY)
Admission: EM | Admit: 2015-07-22 | Discharge: 2015-07-22 | Disposition: A | Discharge status: Home / Self Care | Payer: BC Managed Care – PPO | Attending: Emergency Medicine | Admitting: Emergency Medicine

## 2015-07-22 ENCOUNTER — Encounter (HOSPITAL_COMMUNITY): Payer: Self-pay

## 2015-07-22 DIAGNOSIS — R109 Unspecified abdominal pain: Secondary | ICD-10-CM

## 2015-07-22 DIAGNOSIS — Z79891 Long term (current) use of opiate analgesic: Secondary | ICD-10-CM | POA: Insufficient documentation

## 2015-07-22 LAB — URINALYSIS, ROUTINE W REFLEX MICROSCOPIC
Bilirubin Urine: NEGATIVE
Glucose, UA: NEGATIVE mg/dL
Ketones, ur: NEGATIVE mg/dL
LEUKOCYTES UA: NEGATIVE
NITRITE: NEGATIVE
PH: 7.5 (ref 5.0–8.0)
Protein, ur: 30 mg/dL — AB
SPECIFIC GRAVITY, URINE: 1.015 (ref 1.005–1.030)

## 2015-07-22 LAB — URINE MICROSCOPIC-ADD ON
Squamous Epithelial / LPF: NONE SEEN
WBC UA: NONE SEEN WBC/hpf (ref 0–5)

## 2015-07-22 LAB — CBC WITH DIFFERENTIAL/PLATELET
Basophils Absolute: 0 10*3/uL (ref 0.0–0.1)
Basophils Relative: 0 %
Eosinophils Absolute: 0 10*3/uL (ref 0.0–0.7)
Eosinophils Relative: 0 %
HCT: 31.7 % — ABNORMAL LOW (ref 36.0–46.0)
HEMOGLOBIN: 10.8 g/dL — AB (ref 12.0–15.0)
LYMPHS ABS: 0.7 10*3/uL (ref 0.7–4.0)
Lymphocytes Relative: 5 %
MCH: 31.6 pg (ref 26.0–34.0)
MCHC: 34.1 g/dL (ref 30.0–36.0)
MCV: 92.7 fL (ref 78.0–100.0)
MONOS PCT: 5 %
Monocytes Absolute: 0.7 10*3/uL (ref 0.1–1.0)
NEUTROS ABS: 12 10*3/uL — AB (ref 1.7–7.7)
NEUTROS PCT: 90 %
Platelets: 277 10*3/uL (ref 150–400)
RBC: 3.42 MIL/uL — ABNORMAL LOW (ref 3.87–5.11)
RDW: 13.1 % (ref 11.5–15.5)
WBC: 13.3 10*3/uL — ABNORMAL HIGH (ref 4.0–10.5)

## 2015-07-22 LAB — POC URINE PREG, ED: Preg Test, Ur: NEGATIVE

## 2015-07-22 LAB — BASIC METABOLIC PANEL
ANION GAP: 9 (ref 5–15)
BUN: 19 mg/dL (ref 6–20)
CHLORIDE: 103 mmol/L (ref 101–111)
CO2: 23 mmol/L (ref 22–32)
Calcium: 8.9 mg/dL (ref 8.9–10.3)
Creatinine, Ser: 0.74 mg/dL (ref 0.44–1.00)
GFR calc Af Amer: >60 mL/min (ref >60)
GFR calc non Af Amer: >60 mL/min (ref >60)
Glucose, Bld: 160 mg/dL — ABNORMAL HIGH (ref 65–99)
POTASSIUM: 3.5 mmol/L (ref 3.5–5.1)
SODIUM: 135 mmol/L (ref 135–145)

## 2015-07-22 MED ORDER — HYDROMORPHONE HCL 1 MG/ML IJ SOLN
1.0000 mg | Freq: Once | INTRAMUSCULAR | Status: AC
  Administered 2015-07-22: 1 mg via INTRAVENOUS
  Filled 2015-07-22: qty 1

## 2015-07-22 MED ORDER — KETOROLAC TROMETHAMINE 30 MG/ML IJ SOLN
INTRAMUSCULAR | Status: AC
  Filled 2015-07-22: qty 1

## 2015-07-22 MED ORDER — ONDANSETRON HCL 4 MG/2ML IJ SOLN
4.0000 mg | Freq: Once | INTRAMUSCULAR | Status: AC
  Administered 2015-07-22: 4 mg via INTRAVENOUS
  Filled 2015-07-22: qty 2

## 2015-07-22 MED ORDER — OXYCODONE-ACETAMINOPHEN 5-325 MG PO TABS: 1.0000 | ORAL_TABLET | Freq: Three times a day (TID) | ORAL | Status: DC | PRN

## 2015-07-22 MED ORDER — KETOROLAC TROMETHAMINE 30 MG/ML IJ SOLN
30.0000 mg | Freq: Once | INTRAMUSCULAR | Status: AC
  Administered 2015-07-22: 30 mg via INTRAVENOUS

## 2015-07-22 NOTE — ED Provider Notes (Signed)
The patient is much improved compared to initial presentation, her pain is well controlled, I have discussed with her her results including the urinalysis showing hematuria but no signs of infection. She is agreeable to discharge with Percocet. I will also add a urine strainer, she is encouraged to return should her symptoms worsen, she expressed her understanding and agreement to the plan.  Eber HongBrian Deontae Robson, MD 07/22/15 [phone]

## 2015-07-22 NOTE — ED Notes (Signed)
Pt c/o left flank pain since last night approx 2200.   Pt reports history of same in the past, has been vomiting

## 2015-07-22 NOTE — ED Provider Notes (Signed)
CSN: [id number]     Arrival date & time 07/22/15  [id number] History   First MD Initiated Contact with Patient 07/22/15 0536     Chief Complaint  Patient presents with  . Flank Pain    Patient is a 50 y.o. female presenting with flank pain. The history is provided by the patient.  Flank Pain This is a new problem. The current episode started 6 to 12 hours ago. The problem occurs constantly. The problem has been gradually worsening. Associated symptoms include abdominal pain. Pertinent negatives include no chest pain. Nothing aggravates the symptoms. Nothing relieves the symptoms.  Patient reports onset of left flank pain last night that is worsening It does not radiate She reports nausea/vomiting She reports similar to prior episodes of kidney stones She reports urologic procedure in 2010   Past Medical History  Diagnosis Date  . Renal disorder     kideny stones   Past Surgical History  Procedure Laterality Date  . Basket recovery     No family history on file. Social History  Substance Use Topics  . Smoking status: Never Smoker   . Smokeless tobacco: None  . Alcohol Use: No   OB History    No data available     Review of Systems  Constitutional: Negative for fever.  Cardiovascular: Negative for chest pain.  Gastrointestinal: Positive for abdominal pain.  Genitourinary: Positive for flank pain.  All other systems reviewed and are negative.     Allergies  Review of patient's allergies indicates no known allergies.  Home Medications   Prior to Admission medications   Medication Sig Start Date End Date Taking? Authorizing Provider  HYDROcodone-acetaminophen (NORCO/VICODIN) 5-325 MG per tablet Take 2 tablets by mouth every 4 (four) hours as needed. 03/30/13  Yes Glynn OctaveStephen Rancour, MD  oxyCODONE-acetaminophen (PERCOCET/ROXICET) 5-325 MG tablet Take by mouth every 4 (four) hours as needed for severe pain.   Yes Historical Provider, MD  cyclobenzaprine (FLEXERIL) 10 MG tablet  Take 1 tablet (10 mg total) by mouth 2 (two) times daily as needed for muscle spasms. 03/27/13   Kathrynn Speedobyn M Hess, PA-C  methylPREDNIsolone (MEDROL DOSPACK) 4 MG tablet follow package directions 03/30/13   Glynn OctaveStephen Rancour, MD  traMADol (ULTRAM) 50 MG tablet Take 50 mg by mouth every 6 (six) hours as needed. pain    Historical Provider, MD   BP 129/53 mmHg  Pulse 60  Temp(Src) 97.9 F (36.6 C) (Oral)  Resp 18  Ht 5\' 6"  (1.676 m)  Wt 80.74 kg  BMI 28.74 kg/m2  SpO2 100%  LMP 07/16/2015 Physical Exam CONSTITUTIONAL: Well developed/well nourished, uncomfortable appearing HEAD: Normocephalic/atraumatic EYES: EOMI ENMT: Mucous membranes moist NECK: supple no meningeal signs SPINE/BACK:entire spine nontender CV: S1/S2 noted, no murmurs/rubs/gallops noted LUNGS: Lungs are clear to auscultation bilaterally, no apparent distress ABDOMEN: soft, nontender, no rebound or guarding, bowel sounds noted throughout abdomen ON:GEXBGU:left cva tenderness NEURO: Pt is awake/alert/appropriate, moves all extremitiesx4.  No facial droop.   EXTREMITIES: pulses normal/equal, full ROM SKIN: warm, color normal PSYCH: no abnormalities of mood noted, alert and oriented to situation  ED Course  Procedures  Medications  HYDROmorphone (DILAUDID) injection 1 mg (1 mg Intravenous Given 07/22/15 0546)  ondansetron (ZOFRAN) injection 4 mg (4 mg Intravenous Given 07/22/15 0546)  ketorolac (TORADOL) 30 MG/ML injection 30 mg (30 mg Intravenous Given 07/22/15 0651)    Labs Review Labs Reviewed  BASIC METABOLIC PANEL - Abnormal; Notable for the following:    Glucose, Bld 160 (*)  All other components within normal limits  CBC WITH DIFFERENTIAL/PLATELET - Abnormal; Notable for the following:    WBC 13.3 (*)    RBC 3.42 (*)    Hemoglobin 10.8 (*)    HCT 31.7 (*)    Neutro Abs 12.0 (*)    All other components within normal limits  URINALYSIS, ROUTINE W REFLEX MICROSCOPIC (NOT AT [facility])  POC URINE PREG, ED    I have personally  reviewed and evaluated these  lab results as part of my medical decision-making.   7:37 AM Pt with h/o kidney stones that required stent placement several yrs ago She reports this pain is similar to prior episodes She is now improving with dilaudid and toradol Awaiting urinalysis at this time At signout to dr Hyacinth Meeker, f/u on urinalysis.  If pain resolved, she can be discharged without imaging and presumed ureteral stone that will resolve.  If pain returns she will need CT imaging  MDM   Final diagnoses:  None    Nursing notes including past medical history and social history reviewed and considered in documentation Labs/vital reviewed myself and considered during evaluation     Zadie Rhine, MD 07/22/15 [phone]

## 2015-09-08 ENCOUNTER — Ambulatory Visit (INDEPENDENT_AMBULATORY_CARE_PROVIDER_SITE_OTHER): Payer: BC Managed Care – PPO | Admitting: Urology

## 2015-09-08 DIAGNOSIS — N200 Calculus of kidney: Secondary | ICD-10-CM | POA: Diagnosis not present

## 2015-09-09 ENCOUNTER — Other Ambulatory Visit: Payer: Self-pay | Admitting: Urology

## 2015-09-09 DIAGNOSIS — N200 Calculus of kidney: Secondary | ICD-10-CM

## 2015-09-10 ENCOUNTER — Ambulatory Visit (HOSPITAL_COMMUNITY)
Admission: RE | Admit: 2015-09-10 | Discharge: 2015-09-10 | Disposition: A | Discharge status: Home / Self Care | Payer: BC Managed Care – PPO | Source: Ambulatory Visit | Attending: Urology | Admitting: Urology

## 2015-09-10 ENCOUNTER — Other Ambulatory Visit: Payer: Self-pay | Admitting: Urology

## 2015-09-10 DIAGNOSIS — N200 Calculus of kidney: Secondary | ICD-10-CM | POA: Diagnosis not present

## 2015-11-10 ENCOUNTER — Other Ambulatory Visit: Payer: Self-pay | Admitting: Urology

## 2015-11-10 DIAGNOSIS — N200 Calculus of kidney: Secondary | ICD-10-CM

## 2015-11-19 ENCOUNTER — Ambulatory Visit (HOSPITAL_COMMUNITY)
Admission: RE | Admit: 2015-11-19 | Discharge: 2015-11-19 | Disposition: A | Discharge status: Home / Self Care | Payer: BC Managed Care – PPO | Source: Ambulatory Visit | Attending: Urology | Admitting: Urology

## 2015-11-19 DIAGNOSIS — N200 Calculus of kidney: Secondary | ICD-10-CM

## 2015-12-15 ENCOUNTER — Ambulatory Visit (INDEPENDENT_AMBULATORY_CARE_PROVIDER_SITE_OTHER): Payer: BC Managed Care – PPO | Admitting: Urology

## 2015-12-15 DIAGNOSIS — N200 Calculus of kidney: Secondary | ICD-10-CM | POA: Diagnosis not present

## 2015-12-16 ENCOUNTER — Other Ambulatory Visit: Payer: Self-pay | Admitting: Urology

## 2015-12-29 ENCOUNTER — Encounter (HOSPITAL_COMMUNITY): Payer: Self-pay

## 2015-12-30 ENCOUNTER — Encounter (HOSPITAL_COMMUNITY)
Admission: RE | Disposition: A | Discharge status: Home / Self Care | Payer: Self-pay | Source: Ambulatory Visit | Attending: Urology

## 2015-12-30 ENCOUNTER — Ambulatory Visit (HOSPITAL_COMMUNITY)
Admission: RE | Admit: 2015-12-30 | Discharge: 2015-12-30 | Disposition: A | Discharge status: Home / Self Care | Payer: BC Managed Care – PPO | Source: Ambulatory Visit | Attending: Urology | Admitting: Urology

## 2015-12-30 ENCOUNTER — Ambulatory Visit (HOSPITAL_COMMUNITY): Payer: BC Managed Care – PPO

## 2015-12-30 ENCOUNTER — Encounter (HOSPITAL_COMMUNITY): Payer: Self-pay | Admitting: *Deleted

## 2015-12-30 DIAGNOSIS — N200 Calculus of kidney: Secondary | ICD-10-CM | POA: Diagnosis not present

## 2015-12-30 DIAGNOSIS — Z79899 Other long term (current) drug therapy: Secondary | ICD-10-CM | POA: Insufficient documentation

## 2015-12-30 HISTORY — DX: Personal history of urinary calculi: Z87.442

## 2015-12-30 LAB — HCG, SERUM, QUALITATIVE: Preg, Serum: NEGATIVE

## 2015-12-30 SURGERY — LITHOTRIPSY, ESWL
Anesthesia: LOCAL | Laterality: Right

## 2015-12-30 MED ORDER — DIPHENHYDRAMINE HCL 25 MG PO CAPS
25.0000 mg | ORAL_CAPSULE | ORAL | Status: AC
  Administered 2015-12-30: 25 mg via ORAL
  Filled 2015-12-30: qty 1

## 2015-12-30 MED ORDER — DIAZEPAM 5 MG PO TABS
10.0000 mg | ORAL_TABLET | ORAL | Status: AC
  Administered 2015-12-30: 10 mg via ORAL
  Filled 2015-12-30: qty 2

## 2015-12-30 MED ORDER — OXYCODONE-ACETAMINOPHEN 5-325 MG PO TABS
1.0000 | ORAL_TABLET | Freq: Three times a day (TID) | ORAL | Status: DC | PRN
  Administered 2015-12-30: 1 via ORAL
  Filled 2015-12-30: qty 1

## 2015-12-30 MED ORDER — SODIUM CHLORIDE 0.9 % IV SOLN
INTRAVENOUS | Status: DC
  Administered 2015-12-30: 16:00:00 via INTRAVENOUS

## 2015-12-30 MED ORDER — CIPROFLOXACIN HCL 500 MG PO TABS
500.0000 mg | ORAL_TABLET | ORAL | Status: AC
  Administered 2015-12-30: 500 mg via ORAL
  Filled 2015-12-30: qty 1

## 2015-12-30 NOTE — Discharge Instructions (Signed)
Lithotripsy, Care After °Refer to this sheet in the next few weeks. These instructions provide you with information on caring for yourself after your procedure. Your health care provider may also give you more specific instructions. Your treatment has been planned according to current medical practices, but problems sometimes occur. Call your health care provider if you have any problems or questions after your procedure. °WHAT TO EXPECT AFTER THE PROCEDURE  °· Your urine may have a red tinge for a few days after treatment. Blood loss is usually minimal. °· You may have soreness in the back or flank area. This usually goes away after a few days. The procedure can cause blotches or bruises on the back where the pressure wave enters the skin. These marks usually cause only minimal discomfort and should disappear in a short time. °· Stone fragments should begin to pass within 24 hours of treatment. However, a delayed passage is not unusual. °· You may have pain, discomfort, and feel sick to your stomach (nauseated) when the crushed fragments of stone are passed down the tube from the kidney to the bladder. Stone fragments can pass soon after the procedure and may last for up to 4-8 weeks. °· A small number of patients may have severe pain when stone fragments are not able to pass, which leads to an obstruction. °· If your stone is greater than 1 inch (2.5 cm) in diameter or if you have multiple stones that have a combined diameter greater than 1 inch (2.5 cm), you may require more than one treatment. °· If you had a stent placed prior to your procedure, you may experience some discomfort, especially during urination. You may experience the pain or discomfort in your flank or back, or you may experience a sharp pain or discomfort at the base of your penis or in your lower abdomen. The discomfort usually lasts only a few minutes after urinating. °HOME CARE INSTRUCTIONS  °· Rest at home until you feel your energy  improving. °· Only take over-the-counter or prescription medicines for pain, discomfort, or fever as directed by your health care provider. Depending on the type of lithotripsy, you may need to take antibiotics and anti-inflammatory medicines for a few days. °· Drink enough water and fluids to keep your urine clear or pale yellow. This helps "flush" your kidneys. It helps pass any remaining pieces of stone and prevents stones from coming back. °· Most people can resume daily activities within 1-2 days after standard lithotripsy. It can take longer to recover from laser and percutaneous lithotripsy. °· Strain all urine through the provided strainer. Keep all particulate matter and stones for your health care provider to see. The stone may be as small as a grain of salt. It is very important to use the strainer each and every time you pass your urine. Any stones that are found can be sent to a medical lab for examination. °· Visit your health care provider for a follow-up appointment in a few weeks. Your doctor may remove your stent if you have one. Your health care provider will also check to see whether stone particles still remain. °SEEK MEDICAL CARE IF:  °· Your pain is not relieved by medicine. °· You have a lasting nauseous feeling. °· You feel there is too much blood in the urine. °· You develop persistent problems with frequent or painful urination that does not at least partially improve after 2 days following the procedure. °· You have a congested cough. °· You feel   lightheaded. °· You develop a rash or any other signs that might suggest an allergic problem. °· You develop any reaction or side effects to your medicine(s). °SEEK IMMEDIATE MEDICAL CARE IF:  °· You experience severe back or flank pain or both. °· You see nothing but blood when you urinate. °· You cannot pass any urine at all. °· You have a fever or shaking chills. °· You develop shortness of breath, difficulty breathing, or chest pain. °· You  develop vomiting that will not stop after 6-8 hours. °· You have a fainting episode. °  °This information is not intended to replace advice given to you by your health care provider. Make sure you discuss any questions you have with your health care provider. °  °Document Released: 02/26/2007 Document Revised: 10/28/2014 Document Reviewed: 08/22/2012 °Elsevier Interactive Patient Education ©2016 [employer]    ° ° ° °                                                                                                           Moderate Conscious Sedation, Adult, Care After °Refer to this sheet in the next few weeks. These instructions provide you with information on caring for yourself after your procedure. Your health care provider may also give you more specific instructions. Your treatment has been planned according to current medical practices, but problems sometimes occur. Call your health care provider if you have any problems or questions after your procedure. °WHAT TO EXPECT AFTER THE PROCEDURE  °After your procedure: °· You may feel sleepy, clumsy, and have poor balance for several hours. °· Vomiting may occur if you eat too soon after the procedure. °HOME CARE INSTRUCTIONS °· Do not participate in any activities where you could become injured for at least 24 hours. Do not: °¨ Drive. °¨ Swim. °¨ Ride a bicycle. °¨ Operate heavy machinery. °¨ Cook. °¨ Use power tools. °¨ Climb ladders. °¨ Work from a high place. °· Do not make important decisions or sign legal documents until you are improved. °· If you vomit, drink water, juice, or soup when you can drink without vomiting. Make sure you have little or no nausea before eating solid foods. °· Only take over-the-counter or prescription medicines for pain, discomfort, or fever as directed by your health care provider. °· Make sure you and your family fully understand everything about the medicines given to you, including what side effects may occur. °· You should  not drink alcohol, take sleeping pills, or take medicines that cause drowsiness for at least 24 hours. °· If you smoke, do not smoke without supervision. °· If you are feeling better, you may resume normal activities 24 hours after you were sedated. °· Keep all appointments with your health care provider. °SEEK MEDICAL CARE IF: °· Your skin is pale or bluish in color. °· You continue to feel nauseous or vomit. °· Your pain is getting worse and is not helped by medicine. °· You have bleeding or swelling. °· You are still sleepy or feeling clumsy after 24 hours. °SEEK   IMMEDIATE MEDICAL CARE IF: °· You develop a rash. °· You have difficulty breathing. °· You develop any type of allergic problem. °· You have a fever. °MAKE SURE YOU: °· Understand these instructions. °· Will watch your condition. °· Will get help right away if you are not doing well or get worse. °  °This information is not intended to replace advice given to you by your health care provider. Make sure you discuss any questions you have with your health care provider. °  °Document Released: 11/27/2012 Document Revised: 02/27/2014 Document Reviewed: 11/27/2012 °Elsevier Interactive Patient Education ©2016 [employer] ° °

## 2016-01-02 ENCOUNTER — Encounter (HOSPITAL_COMMUNITY): Payer: Self-pay

## 2016-01-02 ENCOUNTER — Emergency Department (HOSPITAL_COMMUNITY)
Admission: EM | Admit: 2016-01-02 | Discharge: 2016-01-02 | Disposition: A | Discharge status: Home / Self Care | Payer: BC Managed Care – PPO | Attending: Emergency Medicine | Admitting: Emergency Medicine

## 2016-01-02 ENCOUNTER — Emergency Department (HOSPITAL_COMMUNITY): Payer: BC Managed Care – PPO

## 2016-01-02 DIAGNOSIS — Z79899 Other long term (current) drug therapy: Secondary | ICD-10-CM | POA: Insufficient documentation

## 2016-01-02 DIAGNOSIS — N201 Calculus of ureter: Secondary | ICD-10-CM | POA: Diagnosis not present

## 2016-01-02 DIAGNOSIS — R109 Unspecified abdominal pain: Secondary | ICD-10-CM | POA: Diagnosis present

## 2016-01-02 LAB — URINALYSIS, ROUTINE W REFLEX MICROSCOPIC
Bilirubin Urine: NEGATIVE
Glucose, UA: NEGATIVE mg/dL
KETONES UR: 40 mg/dL — AB
NITRITE: NEGATIVE
PH: 7.5 (ref 5.0–8.0)
Protein, ur: NEGATIVE mg/dL
SPECIFIC GRAVITY, URINE: 1.015 (ref 1.005–1.030)

## 2016-01-02 LAB — URINE MICROSCOPIC-ADD ON

## 2016-01-02 MED ORDER — ONDANSETRON 8 MG PO TBDP: 8.0000 mg | ORAL_TABLET | Freq: Three times a day (TID) | ORAL | 0 refills | Status: DC | PRN

## 2016-01-02 MED ORDER — KETOROLAC TROMETHAMINE 60 MG/2ML IM SOLN
60.0000 mg | Freq: Once | INTRAMUSCULAR | Status: AC
  Administered 2016-01-02: 60 mg via INTRAMUSCULAR
  Filled 2016-01-02: qty 2

## 2016-01-02 MED ORDER — ONDANSETRON HCL 4 MG/2ML IJ SOLN
4.0000 mg | Freq: Once | INTRAMUSCULAR | Status: AC
  Administered 2016-01-02: 4 mg via INTRAMUSCULAR
  Filled 2016-01-02: qty 2

## 2016-01-02 MED ORDER — HYDROMORPHONE HCL 1 MG/ML IJ SOLN
1.0000 mg | Freq: Once | INTRAMUSCULAR | Status: AC
  Administered 2016-01-02: 1 mg via INTRAMUSCULAR
  Filled 2016-01-02: qty 1

## 2016-01-02 NOTE — ED Triage Notes (Signed)
Patient states that she is having pain in her right kidney and in her bladder.  States that she started hurting around noon yesterday and it has became worse.  Vomited once.  States that she had lithotripsy on Thursday for a stone that 2 cm.

## 2016-01-02 NOTE — ED Notes (Signed)
Done bladder scan on patient found of urine.

## 2016-01-02 NOTE — Discharge Instructions (Signed)
Drink plenty of fluids. You can take 2 pain pills if your pain is severe. Use the zofran for nausea or vomiting. If you get a fever, or have uncontrolled vomiting or pain, [facility]. Dr Laverle PatterBorden, Urologist on call for Dr Ronne BinningMcKenzie, has looked at your xrays and may need to place a stent if you get worse.

## 2016-01-02 NOTE — ED Notes (Signed)
Dr Lynelle DoctorKnapp in to reevaluate

## 2016-01-02 NOTE — ED Provider Notes (Signed)
[facility] Provider Note   CSN: [id number] Arrival date & time: 01/02/16  0141  Time seen 02:03 AM  History   Chief Complaint Chief Complaint  Patient presents with  . Flank Pain    HPI Tracy Serrano is a 50 y.o. female.  HPI patient reports she had lithotripsy on the ninth for an asymptomatic large renal stone on the right. She reports on the 10th she passed a lot of gravel. However tonight she was having some cramping right-sided flank pain that radiates into her right abdomen that got worse around midnight. She last took her pain medicine about 7 PM. She reports nausea and has vomited. She has had some dysuria without frequency or hematuria.  Patient has a history of kidney stones. She has passed several spontaneously and has had a basket retrieval done in 2010. She reports no pain prior to having the lithotripsy done.   PCP Dr Hughie [facility] Dr Ronne BinningMcKenzie  Past Medical History:  Diagnosis Date  . History of kidney stones   . Renal disorder    kideny stones    There are no active problems to display for this patient.   Past Surgical History:  Procedure Laterality Date  . basket recovery    . ESSURE TUBAL LIGATION      OB History    No data available       Home Medications    Prior to Admission medications   Medication Sig Start Date End Date Taking? Authorizing Provider  ferrous sulfate 325 (65 FE) MG EC tablet Take 325 mg by mouth daily.   Yes Historical Provider, MD  fexofenadine (ALLEGRA) 60 MG tablet Take 60 mg by mouth daily.   Yes Historical Provider, MD  Multiple Vitamin (MULTIVITAMIN) tablet Take 1 tablet by mouth daily.   Yes Historical Provider, MD  oxyCODONE-acetaminophen (PERCOCET/ROXICET) 5-325 MG tablet Take 1 tablet by mouth every 8 (eight) hours as needed for severe pain. 07/22/15  Yes Zadie Rhineonald Wickline, MD  ondansetron (ZOFRAN-ODT) 8 MG disintegrating tablet Take 1 tablet (8 mg total) by mouth every 8 (eight) hours as needed for nausea  or vomiting. 01/02/16   Devoria AlbeIva Aveena Bari, MD    Family History No family history on file.  Social History Social History  Substance Use Topics  . Smoking status: Never Smoker  . Smokeless tobacco: Never Used  . Alcohol use No  Employed as a second grade teacher   Allergies   Patient has no known allergies.   Review of Systems Review of Systems  All other systems reviewed and are negative.    Physical Exam Updated Vital Signs BP 131/62 (BP Location: Right Arm)   Pulse 65   Temp 97.8 F (36.6 C) (Oral)   Resp 18   LMP 12/19/2015 (Exact Date)   SpO2 100%   Vital signs normal    Physical Exam  Constitutional: She is oriented to person, place, and time. She appears well-developed and well-nourished.  Non-toxic appearance. She does not appear ill. She appears distressed.  HENT:  Head: Normocephalic and atraumatic.  Right Ear: External ear normal.  Left Ear: External ear normal.  Nose: Nose normal. No mucosal edema or rhinorrhea.  Mouth/Throat: Oropharynx is clear and moist and mucous membranes are normal. No dental abscesses or uvula swelling.  Eyes: Conjunctivae and EOM are normal. Pupils are equal, round, and reactive to light.  Neck: Normal range of motion and full passive range of motion without pain. Neck supple.  Cardiovascular: Normal rate, regular  rhythm and normal heart sounds.  Exam reveals no gallop and no friction rub.   No murmur heard. Pulmonary/Chest: Effort normal and breath sounds normal. No respiratory distress. She has no wheezes. She has no rhonchi. She has no rales. She exhibits no tenderness and no crepitus.  Abdominal: Soft. Normal appearance and bowel sounds are normal. She exhibits no distension. There is no tenderness. There is no rebound and no guarding.  Genitourinary:  Genitourinary Comments: Right CVAT  Musculoskeletal: Normal range of motion. She exhibits no edema or tenderness.  Moves all extremities well.   Neurological: She is alert and  oriented to person, place, and time. She has normal strength. No cranial nerve deficit.  Skin: Skin is warm, dry and intact. No rash noted. No erythema. No pallor.  Psychiatric: She has a normal mood and affect. Her speech is normal and behavior is normal. Her mood appears not anxious.  Nursing note and vitals reviewed.    ED Treatments / Results  Labs (all labs ordered are listed, but only abnormal results are displayed) Results for orders placed or performed during the hospital encounter of 01/02/16  Urinalysis, Routine w reflex microscopic  Result Value Ref Range   Color, Urine YELLOW YELLOW   APPearance HAZY (A) CLEAR   Specific Gravity, Urine 1.015 1.005 - 1.030   pH 7.5 5.0 - 8.0   Glucose, UA NEGATIVE NEGATIVE mg/dL   Hgb urine dipstick LARGE (A) NEGATIVE   Bilirubin Urine NEGATIVE NEGATIVE   Ketones, ur 40 (A) NEGATIVE mg/dL   Protein, ur NEGATIVE NEGATIVE mg/dL   Nitrite NEGATIVE NEGATIVE   Leukocytes, UA SMALL (A) NEGATIVE  Urine microscopic-add on  Result Value Ref Range   Squamous Epithelial / LPF 6-30 (A) NONE SEEN   WBC, UA 0-5 0 - 5 WBC/hpf   RBC / HPF 6-30 0 - 5 RBC/hpf   Bacteria, UA FEW (A) NONE SEEN       EKG  EKG Interpretation None       Radiology Dg Abdomen 1 View  Result Date: 01/02/2016 CLINICAL DATA:  Pain in the right kidney and bladder. Pain beginning around noon yesterday and worsening. Vomiting. Lithotripsy on Thursday for a stone. EXAM: ABDOMEN - 1 VIEW COMPARISON:  12/30/2015 FINDINGS: Evaluation is limited due to colonic gas and stool overlying the renal shadows. 16 mm stone projected over the upper pole of the right kidney appears to be fragmented in place. Additional stone fragments demonstrated to the right of L2-3, new since previous study, likely representing stone fragments in the renal pelvis. Multiple tiny vaguely radiopaque stone fragments are demonstrated in the right pelvis consistent with location in the distal right ureter.  Calcified phleboliths. Previous tubal ligations. Degenerative changes in the spine. No small or large bowel distention. IMPRESSION: Residual right renal, right renal pelvis, and right ureteral stones and stone fragments as discussed. Stool-filled colon limits examination. No bowel obstruction. Electronically Signed   By: Burman Nieves M.D.   On: 01/02/2016 04:40     Dg Abd 1 View  Result Date: 12/30/2015 CLINICAL DATA:  Renal stone.  Pre lithotripsy exam. EXAM: ABDOMEN - 1 VIEW COMPARISON:  11/19/2015. FINDINGS: 12 mm stone again projects over the expected location of the right renal pelvis. Smaller stones are seen over the interpolar and lower pole regions of the right kidney. Numerous phleboliths project over the anatomic pelvis. Essure fallopian tube occlusion devices noted over the anatomic pelvis. IMPRESSION: Stable appearance of right-sided renal stones. Electronically Signed   By:  Kennith CenterEric  Mansell M.D.   On: 12/30/2015 16:01   Procedures Procedures (including critical care time)  Medications Ordered in ED Medications  HYDROmorphone (DILAUDID) injection 1 mg (not administered)  ondansetron (ZOFRAN) injection 4 mg (4 mg Intramuscular Given 01/02/16 0231)  ketorolac (TORADOL) injection 60 mg (60 mg Intramuscular Given 01/02/16 0232)     Initial Impression / Assessment and Plan / ED Course  I have reviewed the triage vital signs and the nursing notes.  Pertinent labs & imaging results that were available during my care of the patient were reviewed by me and considered in my medical decision making (see chart for details).  Clinical Course    Patient was given IM Zofran and IM Toradol for her pain.  Recheck at 03:10 AM pain improved, not gone. Does not need anything else for pain right now. Abdomen 1 view ordered to look for stone fragments. US not available at night.   Bladder scan showed 137 cc or urine, so no urinary retention present.  Recheck 05:00 AM discussed her test results,  still has pain but doesn't want anything right now for pain. Will discuss with Urology.   05:21 Dr Laverle PatterBorden, has reviewed her xray. States to make sure she is on flomax (daughter verifies she is) and if she gets worse today, [facility] and he may need to place a stent. This was relayed to patient and her daughter. Patient states her pain is starting to return. She was given dilaudid IM. Pt received # 30 percocet on 11/10, advised she could take 2 if her pain is severe. She was given zofran for nausea.   Final Clinical Impressions(s) / ED Diagnoses   Final diagnoses:  Right ureteral stone    New Prescriptions New Prescriptions   ONDANSETRON (ZOFRAN-ODT) 8 MG DISINTEGRATING TABLET    Take 1 tablet (8 mg total) by mouth every 8 (eight) hours as needed for nausea or vomiting.    Plan discharge  Devoria AlbeIva Hanaa Payes, MD, [facility] PyoFACEP    Verlon Pischke, MD 01/02/16 [phone]

## 2016-01-12 ENCOUNTER — Other Ambulatory Visit (HOSPITAL_COMMUNITY)
Admission: RE | Admit: 2016-01-12 | Discharge: 2016-01-12 | Disposition: A | Discharge status: Home / Self Care | Payer: BC Managed Care – PPO | Source: Ambulatory Visit | Attending: Urology | Admitting: Urology

## 2016-01-12 ENCOUNTER — Other Ambulatory Visit: Payer: Self-pay | Admitting: Urology

## 2016-01-12 ENCOUNTER — Ambulatory Visit (INDEPENDENT_AMBULATORY_CARE_PROVIDER_SITE_OTHER): Payer: Self-pay | Admitting: Urology

## 2016-01-12 ENCOUNTER — Ambulatory Visit (HOSPITAL_COMMUNITY)
Admission: RE | Admit: 2016-01-12 | Discharge: 2016-01-12 | Disposition: A | Discharge status: Home / Self Care | Payer: BC Managed Care – PPO | Source: Ambulatory Visit | Attending: Urology | Admitting: Urology

## 2016-01-12 DIAGNOSIS — N200 Calculus of kidney: Secondary | ICD-10-CM

## 2016-01-12 DIAGNOSIS — N201 Calculus of ureter: Secondary | ICD-10-CM

## 2016-01-21 LAB — STONE ANALYSIS
Ca Oxalate,Dihydrate: 35 %
Ca Oxalate,Monohydr.: 55 %
Ca phos cry stone ql IR: 10 %
STONE WEIGHT KSTONE: 321.4 mg

## 2016-01-25 ENCOUNTER — Ambulatory Visit (HOSPITAL_COMMUNITY)
Admission: RE | Admit: 2016-01-25 | Discharge: 2016-01-25 | Disposition: A | Discharge status: Home / Self Care | Payer: BC Managed Care – PPO | Source: Ambulatory Visit | Attending: Internal Medicine | Admitting: Internal Medicine

## 2016-01-25 ENCOUNTER — Other Ambulatory Visit (HOSPITAL_COMMUNITY): Payer: Self-pay | Admitting: Internal Medicine

## 2016-01-25 DIAGNOSIS — R52 Pain, unspecified: Secondary | ICD-10-CM | POA: Diagnosis not present

## 2016-01-25 DIAGNOSIS — N200 Calculus of kidney: Secondary | ICD-10-CM | POA: Diagnosis not present

## 2016-01-26 ENCOUNTER — Ambulatory Visit (INDEPENDENT_AMBULATORY_CARE_PROVIDER_SITE_OTHER): Payer: Self-pay | Admitting: Urology

## 2016-01-26 DIAGNOSIS — N201 Calculus of ureter: Secondary | ICD-10-CM

## 2016-01-26 DIAGNOSIS — N200 Calculus of kidney: Secondary | ICD-10-CM

## 2016-04-07 ENCOUNTER — Other Ambulatory Visit: Payer: Self-pay | Admitting: Urology

## 2016-04-07 DIAGNOSIS — N200 Calculus of kidney: Secondary | ICD-10-CM

## 2016-05-10 ENCOUNTER — Ambulatory Visit (HOSPITAL_COMMUNITY)
Admission: RE | Admit: 2016-05-10 | Discharge: 2016-05-10 | Disposition: A | Discharge status: Home / Self Care | Payer: BC Managed Care – PPO | Source: Ambulatory Visit | Attending: Urology | Admitting: Urology

## 2016-05-10 ENCOUNTER — Ambulatory Visit (INDEPENDENT_AMBULATORY_CARE_PROVIDER_SITE_OTHER): Payer: BC Managed Care – PPO | Admitting: Urology

## 2016-05-10 ENCOUNTER — Other Ambulatory Visit (HOSPITAL_COMMUNITY): Payer: Self-pay | Admitting: Obstetrics and Gynecology

## 2016-05-10 DIAGNOSIS — N201 Calculus of ureter: Secondary | ICD-10-CM

## 2016-05-10 DIAGNOSIS — K5900 Constipation, unspecified: Secondary | ICD-10-CM | POA: Insufficient documentation

## 2016-05-10 DIAGNOSIS — Z1231 Encounter for screening mammogram for malignant neoplasm of breast: Secondary | ICD-10-CM

## 2016-05-10 DIAGNOSIS — N200 Calculus of kidney: Secondary | ICD-10-CM | POA: Diagnosis not present

## 2016-05-15 ENCOUNTER — Ambulatory Visit (HOSPITAL_COMMUNITY): Payer: BC Managed Care – PPO

## 2016-05-18 ENCOUNTER — Ambulatory Visit (HOSPITAL_COMMUNITY)
Admission: RE | Admit: 2016-05-18 | Discharge: 2016-05-18 | Disposition: A | Discharge status: Home / Self Care | Payer: BC Managed Care – PPO | Source: Ambulatory Visit | Attending: Obstetrics and Gynecology | Admitting: Obstetrics and Gynecology

## 2016-05-18 DIAGNOSIS — Z1231 Encounter for screening mammogram for malignant neoplasm of breast: Secondary | ICD-10-CM

## 2016-05-18 DIAGNOSIS — N6489 Other specified disorders of breast: Secondary | ICD-10-CM | POA: Diagnosis not present

## 2016-05-22 ENCOUNTER — Ambulatory Visit (HOSPITAL_COMMUNITY): Payer: BC Managed Care – PPO

## 2016-09-06 ENCOUNTER — Other Ambulatory Visit: Payer: Self-pay | Admitting: Dermatology

## 2016-11-14 ENCOUNTER — Ambulatory Visit (HOSPITAL_COMMUNITY)
Admission: RE | Admit: 2016-11-14 | Discharge: 2016-11-14 | Disposition: A | Discharge status: Home / Self Care | Payer: BC Managed Care – PPO | Source: Ambulatory Visit | Attending: Urology | Admitting: Urology

## 2016-11-14 ENCOUNTER — Other Ambulatory Visit: Payer: Self-pay | Admitting: Urology

## 2016-11-14 DIAGNOSIS — N200 Calculus of kidney: Secondary | ICD-10-CM | POA: Diagnosis not present

## 2016-11-15 ENCOUNTER — Ambulatory Visit (INDEPENDENT_AMBULATORY_CARE_PROVIDER_SITE_OTHER): Payer: BC Managed Care – PPO | Admitting: Urology

## 2016-11-15 DIAGNOSIS — N201 Calculus of ureter: Secondary | ICD-10-CM

## 2016-11-15 DIAGNOSIS — N200 Calculus of kidney: Secondary | ICD-10-CM

## 2016-11-22 ENCOUNTER — Other Ambulatory Visit: Payer: Self-pay | Admitting: Urology

## 2016-12-13 ENCOUNTER — Ambulatory Visit: Payer: BC Managed Care – PPO | Admitting: Urology

## 2016-12-19 ENCOUNTER — Encounter (HOSPITAL_COMMUNITY): Payer: Self-pay | Admitting: *Deleted

## 2016-12-21 ENCOUNTER — Ambulatory Visit (HOSPITAL_COMMUNITY)
Admission: RE | Admit: 2016-12-21 | Discharge: 2016-12-21 | Disposition: A | Discharge status: Home / Self Care | Payer: BC Managed Care – PPO | Source: Ambulatory Visit | Attending: Urology | Admitting: Urology

## 2016-12-21 ENCOUNTER — Ambulatory Visit (HOSPITAL_COMMUNITY): Payer: BC Managed Care – PPO

## 2016-12-21 ENCOUNTER — Encounter (HOSPITAL_COMMUNITY): Payer: Self-pay | Admitting: General Practice

## 2016-12-21 ENCOUNTER — Encounter (HOSPITAL_COMMUNITY)
Admission: RE | Disposition: A | Discharge status: Home / Self Care | Payer: Self-pay | Source: Ambulatory Visit | Attending: Urology

## 2016-12-21 DIAGNOSIS — N202 Calculus of kidney with calculus of ureter: Secondary | ICD-10-CM | POA: Insufficient documentation

## 2016-12-21 DIAGNOSIS — N201 Calculus of ureter: Secondary | ICD-10-CM

## 2016-12-21 HISTORY — PX: EXTRACORPOREAL SHOCK WAVE LITHOTRIPSY: SHX1557

## 2016-12-21 LAB — PREGNANCY, URINE: Preg Test, Ur: NEGATIVE

## 2016-12-21 SURGERY — LITHOTRIPSY, ESWL
Anesthesia: LOCAL | Laterality: Right

## 2016-12-21 MED ORDER — DIAZEPAM 5 MG PO TABS
10.0000 mg | ORAL_TABLET | ORAL | Status: AC
  Administered 2016-12-21: 10 mg via ORAL
  Filled 2016-12-21: qty 2

## 2016-12-21 MED ORDER — DIPHENHYDRAMINE HCL 25 MG PO CAPS
25.0000 mg | ORAL_CAPSULE | ORAL | Status: AC
  Administered 2016-12-21: 25 mg via ORAL
  Filled 2016-12-21: qty 1

## 2016-12-21 MED ORDER — CIPROFLOXACIN HCL 500 MG PO TABS
500.0000 mg | ORAL_TABLET | ORAL | Status: AC
  Administered 2016-12-21: 500 mg via ORAL
  Filled 2016-12-21: qty 1

## 2016-12-21 MED ORDER — SODIUM CHLORIDE 0.9 % IV SOLN
INTRAVENOUS | Status: DC
  Administered 2016-12-21: 16:00:00 via INTRAVENOUS

## 2016-12-22 ENCOUNTER — Encounter (HOSPITAL_COMMUNITY): Payer: Self-pay | Admitting: Urology

## 2017-01-08 ENCOUNTER — Ambulatory Visit (HOSPITAL_COMMUNITY)
Admission: RE | Admit: 2017-01-08 | Discharge: 2017-01-08 | Disposition: A | Discharge status: Home / Self Care | Payer: BC Managed Care – PPO | Source: Ambulatory Visit | Attending: Urology | Admitting: Urology

## 2017-01-08 ENCOUNTER — Other Ambulatory Visit: Payer: Self-pay | Admitting: Urology

## 2017-01-08 DIAGNOSIS — R52 Pain, unspecified: Secondary | ICD-10-CM

## 2017-01-08 DIAGNOSIS — N201 Calculus of ureter: Secondary | ICD-10-CM | POA: Insufficient documentation

## 2017-01-10 ENCOUNTER — Other Ambulatory Visit (HOSPITAL_COMMUNITY)
Admission: RE | Admit: 2017-01-10 | Discharge: 2017-01-10 | Disposition: A | Discharge status: Home / Self Care | Payer: BC Managed Care – PPO | Source: Ambulatory Visit | Attending: Urology | Admitting: Urology

## 2017-01-10 ENCOUNTER — Ambulatory Visit (INDEPENDENT_AMBULATORY_CARE_PROVIDER_SITE_OTHER): Payer: BC Managed Care – PPO | Admitting: Urology

## 2017-01-10 DIAGNOSIS — N200 Calculus of kidney: Secondary | ICD-10-CM

## 2017-01-10 DIAGNOSIS — N201 Calculus of ureter: Secondary | ICD-10-CM

## 2017-01-22 LAB — STONE ANALYSIS
CA PHOS CRY STONE QL IR: 30 %
Ca Oxalate,Dihydrate: 20 %
Ca Oxalate,Monohydr.: 50 %
STONE WEIGHT KSTONE: 77.2 mg

## 2017-02-14 ENCOUNTER — Ambulatory Visit (INDEPENDENT_AMBULATORY_CARE_PROVIDER_SITE_OTHER): Payer: BC Managed Care – PPO | Admitting: Urology

## 2017-02-14 DIAGNOSIS — N201 Calculus of ureter: Secondary | ICD-10-CM

## 2017-02-14 DIAGNOSIS — N200 Calculus of kidney: Secondary | ICD-10-CM | POA: Diagnosis not present

## 2017-07-30 ENCOUNTER — Other Ambulatory Visit (HOSPITAL_COMMUNITY): Payer: Self-pay | Admitting: Obstetrics and Gynecology

## 2017-07-30 DIAGNOSIS — Z1231 Encounter for screening mammogram for malignant neoplasm of breast: Secondary | ICD-10-CM

## 2017-08-08 ENCOUNTER — Ambulatory Visit (HOSPITAL_COMMUNITY)
Admission: RE | Admit: 2017-08-08 | Discharge: 2017-08-08 | Disposition: A | Discharge status: Home / Self Care | Payer: BC Managed Care – PPO | Source: Ambulatory Visit | Attending: Obstetrics and Gynecology | Admitting: Obstetrics and Gynecology

## 2017-08-08 ENCOUNTER — Ambulatory Visit (HOSPITAL_COMMUNITY)
Admission: RE | Admit: 2017-08-08 | Discharge: 2017-08-08 | Disposition: A | Discharge status: Home / Self Care | Payer: BC Managed Care – PPO | Source: Ambulatory Visit | Attending: Urology | Admitting: Urology

## 2017-08-08 ENCOUNTER — Other Ambulatory Visit: Payer: Self-pay | Admitting: Urology

## 2017-08-08 DIAGNOSIS — N200 Calculus of kidney: Secondary | ICD-10-CM | POA: Diagnosis present

## 2017-08-08 DIAGNOSIS — Z1231 Encounter for screening mammogram for malignant neoplasm of breast: Secondary | ICD-10-CM | POA: Diagnosis present

## 2017-08-15 ENCOUNTER — Ambulatory Visit: Payer: BC Managed Care – PPO | Admitting: Urology

## 2017-08-15 DIAGNOSIS — N200 Calculus of kidney: Secondary | ICD-10-CM

## 2017-08-15 DIAGNOSIS — N201 Calculus of ureter: Secondary | ICD-10-CM

## 2018-01-17 IMAGING — DX DG ABDOMEN 1V
1 series · 1 of 1 positions shown · non-contrast
Comparison: 11/19/2015.

CLINICAL DATA: Renal stone.  Pre lithotripsy exam.

EXAM:
ABDOMEN - 1 VIEW

[abdomen kub]
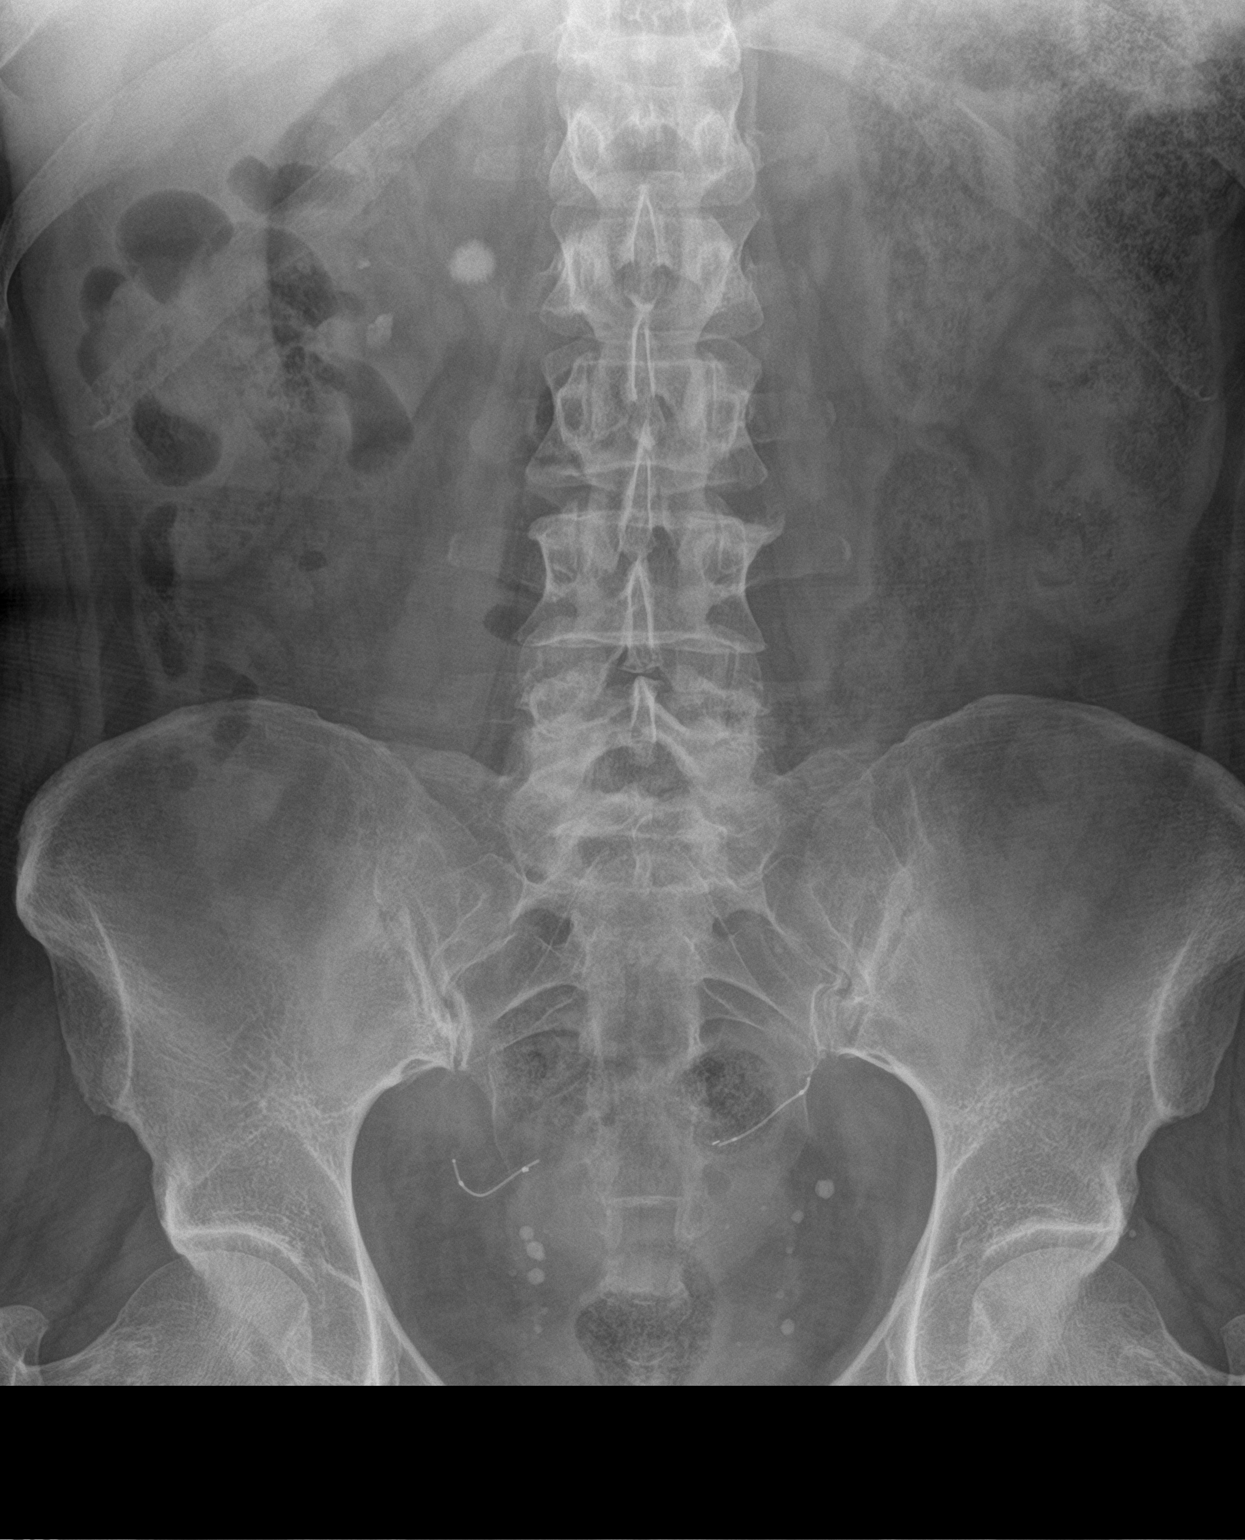

[1 of 1 positions shown; findings below may reference images not displayed]

FINDINGS: 12 mm stone again projects over the expected location of the right
renal pelvis. Smaller stones are seen over the interpolar and lower
pole regions of the right kidney. Numerous phleboliths project over
the anatomic pelvis. Essure fallopian tube occlusion devices noted
over the anatomic pelvis.
IMPRESSION: Stable appearance of right-sided renal stones.

## 2018-01-20 IMAGING — DX DG ABDOMEN 1V
1 series · 1 of 1 positions shown · non-contrast
Comparison: 12/30/2015

CLINICAL DATA: Pain in the right kidney and bladder. Pain beginning
around noon yesterday and worsening. Vomiting. Lithotripsy on
[REDACTED] for a stone.

EXAM:
ABDOMEN - 1 VIEW

[abdomen kub]
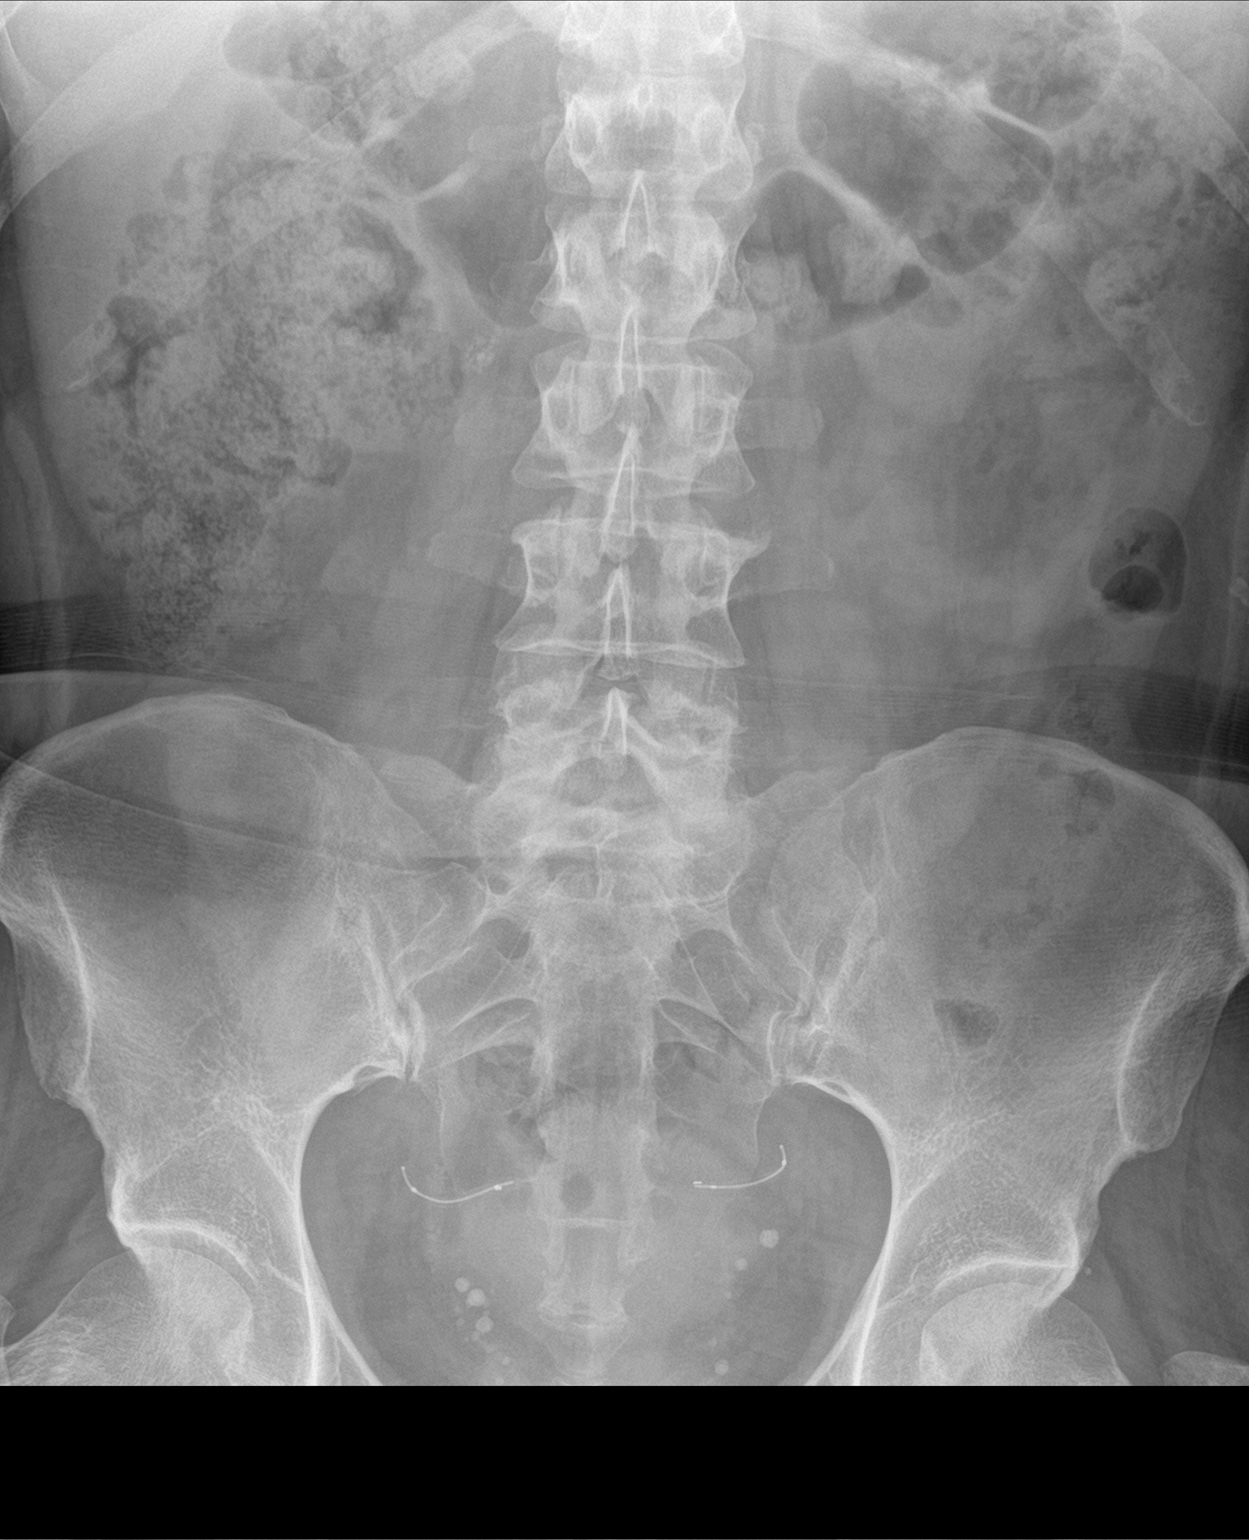

[1 of 1 positions shown; findings below may reference images not displayed]

FINDINGS: Evaluation is limited due to colonic gas and stool overlying the
renal shadows. 16 mm stone projected over the upper pole of the
right kidney appears to be fragmented in place. Additional stone
fragments demonstrated to the right of L2-3, new since previous
study, likely representing stone fragments in the renal pelvis.
Multiple tiny vaguely radiopaque stone fragments are demonstrated in
the right pelvis consistent with location in the distal right
ureter. Calcified phleboliths. Previous tubal ligations.
Degenerative changes in the spine.

No small or large bowel distention.
IMPRESSION: Residual right renal, right renal pelvis, and right ureteral stones
and stone fragments as discussed. Stool-filled colon limits
examination. No bowel obstruction.

## 2018-08-05 ENCOUNTER — Other Ambulatory Visit (HOSPITAL_COMMUNITY): Payer: Self-pay | Admitting: Obstetrics and Gynecology

## 2018-08-05 ENCOUNTER — Other Ambulatory Visit (HOSPITAL_COMMUNITY): Payer: Self-pay | Admitting: Neurosurgery

## 2018-08-05 DIAGNOSIS — Z1231 Encounter for screening mammogram for malignant neoplasm of breast: Secondary | ICD-10-CM

## 2018-08-07 ENCOUNTER — Ambulatory Visit (HOSPITAL_COMMUNITY)
Admission: RE | Admit: 2018-08-07 | Discharge: 2018-08-07 | Disposition: A | Discharge status: Home / Self Care | Payer: BC Managed Care – PPO | Source: Ambulatory Visit | Attending: Urology | Admitting: Urology

## 2018-08-07 ENCOUNTER — Other Ambulatory Visit: Payer: Self-pay

## 2018-08-07 ENCOUNTER — Ambulatory Visit (INDEPENDENT_AMBULATORY_CARE_PROVIDER_SITE_OTHER): Payer: BC Managed Care – PPO | Admitting: Urology

## 2018-08-07 ENCOUNTER — Other Ambulatory Visit (HOSPITAL_COMMUNITY): Payer: Self-pay | Admitting: Urology

## 2018-08-07 DIAGNOSIS — N200 Calculus of kidney: Secondary | ICD-10-CM

## 2018-08-07 DIAGNOSIS — N201 Calculus of ureter: Secondary | ICD-10-CM | POA: Diagnosis not present

## 2018-08-14 ENCOUNTER — Ambulatory Visit (HOSPITAL_COMMUNITY)
Admission: RE | Admit: 2018-08-14 | Discharge: 2018-08-14 | Disposition: A | Discharge status: Home / Self Care | Payer: BC Managed Care – PPO | Source: Ambulatory Visit | Attending: Obstetrics and Gynecology | Admitting: Obstetrics and Gynecology

## 2018-08-14 ENCOUNTER — Other Ambulatory Visit: Payer: Self-pay

## 2018-08-14 DIAGNOSIS — Z1231 Encounter for screening mammogram for malignant neoplasm of breast: Secondary | ICD-10-CM | POA: Insufficient documentation

## 2019-03-03 ENCOUNTER — Other Ambulatory Visit: Payer: Self-pay

## 2019-03-03 DIAGNOSIS — N200 Calculus of kidney: Secondary | ICD-10-CM

## 2019-08-14 ENCOUNTER — Other Ambulatory Visit (HOSPITAL_COMMUNITY): Payer: Self-pay | Admitting: Obstetrics and Gynecology

## 2019-08-14 DIAGNOSIS — Z1231 Encounter for screening mammogram for malignant neoplasm of breast: Secondary | ICD-10-CM

## 2019-08-28 ENCOUNTER — Other Ambulatory Visit: Payer: Self-pay

## 2019-08-28 ENCOUNTER — Ambulatory Visit (HOSPITAL_COMMUNITY)
Admission: RE | Admit: 2019-08-28 | Discharge: 2019-08-28 | Disposition: A | Discharge status: Home / Self Care | Payer: BC Managed Care – PPO | Source: Ambulatory Visit | Attending: Obstetrics and Gynecology | Admitting: Obstetrics and Gynecology

## 2019-08-28 DIAGNOSIS — Z1231 Encounter for screening mammogram for malignant neoplasm of breast: Secondary | ICD-10-CM | POA: Diagnosis present

## 2019-12-12 ENCOUNTER — Other Ambulatory Visit: Payer: Self-pay

## 2019-12-12 ENCOUNTER — Encounter: Payer: Self-pay | Admitting: Allergy & Immunology

## 2019-12-12 ENCOUNTER — Ambulatory Visit (INDEPENDENT_AMBULATORY_CARE_PROVIDER_SITE_OTHER): Payer: BC Managed Care – PPO | Admitting: Allergy & Immunology

## 2019-12-12 VITALS — BP 118/72 | HR 92 | Temp 98.3°F | Resp 17 | Ht 66.0 in | Wt 194.8 lb

## 2019-12-12 DIAGNOSIS — J302 Other seasonal allergic rhinitis: Secondary | ICD-10-CM

## 2019-12-12 DIAGNOSIS — J3089 Other allergic rhinitis: Secondary | ICD-10-CM

## 2019-12-12 DIAGNOSIS — T7840XD Allergy, unspecified, subsequent encounter: Secondary | ICD-10-CM | POA: Diagnosis not present

## 2019-12-12 NOTE — Progress Notes (Signed)
NEW PATIENT  Date of Service/Encounter:  12/12/19  Referring provider: Benita StabileHall, John Z, MD   Assessment:   Allergic reaction - unknown trigger  Seasonal and perennial allergic rhinitis (grasses, ragweed, weeds, trees, indoor molds, outdoor molds, dust mites, cat and dog)  Fully vaccinated to COVID-19  Plan/Recommendations:   1. Allergic reaction - unknown trigger - I am not sure what is causing your reaction. - There are sulfite allergies associated with allergic reactions to alcohols, but these are typically just asthmatic symptoms and typically with preserved foods as well such as cheese and sausages. - There are also tannin sensitivities, but these are typically only with red wines. - I would avoid alcohol for now to avoid any reactions. - Testing was essentially negative to the most common foods. - Copy of test results provided. - Testing was positive to some tree nuts, but they were not large at all and likely not relevant. - EpiPen training provided just to be on the safe side. - Anaphylaxis management plan provided. - Take notes of triggers of future reactions. - We are going to get some labs to rule out serious causes of hives/allergic reactions.   2. Seasonal and perennial allergic rhinitis - Testing today showed: grasses, ragweed, weeds, trees, indoor molds, outdoor molds, dust mites, cat and dog - Copy of test results provided.  - Avoidance measures provided. - Continue with: your antihistamines daily. - I do not think that allergy shots are needed at this time, but we can consider them in the future.   3. Return in about 2 months (around 02/11/2020).    Subjective:   Tracy Serrano is a 54 y.o. female presenting today for evaluation of  Chief Complaint  Patient presents with  . Allergic Reaction    possible allergic reaction to alcohol   . Allergic Rhinitis     scratcy throat,congestion, runny nose,itchy ears    Tracy Serrano has a history of the  following: Patient Active Problem List   Diagnosis Date Noted  . Seasonal and perennial allergic rhinitis 12/15/2019    History obtained from: chart review and patient.  Tracy Serrano was referred by Benita StabileHall, John Z, MD.     Tracy JunkerMarsha is a 54 y.o. female presenting for an evaluation of an alcohol allergy. The first time that she took a sip in May 2021, she started having issues. She reports throat swelling, hands itching and swelling. She was outside in the evening around a campfire. She thought it was the pollen or the moldy wood. She did not drink more than a sip. These were "buzz ball" which are these drinks that look like a Christmas ornament.   She was at the beach and drank some [place] wine. She mixed it with Sprite and she started feeling the same set of symptoms.  In August 2021, around her birthday, she went to [employer]. She ordered a thunder tini with a brownie in it and cocoa with whip cream. She had breaking out, sweating and she ended up getting vomiting sick within an hour. This was when she was getting gas at the  She got the COVID vaccinations in March and April 2021. All of these episodes started after the shots. The last time that she drank alcohol before the campfire episode was 6-7 years.   There is no common food between them. She thought maybe it was peanyuts M&Ms but she had those without a problem. Then she thought maybe it was "red dye" since the  [place] wine was red.  She otherwise tolerates all major food allergens without adverse event.  Allergic Rhinitis Symptom History: She takes cetirizine and levocetirizine. She alternates them, whichever is the least expensive or the generic brand. She started with Claritin when it was a prescription. She does have worsened staying off of the antihistamines.  Otherwise, there is no history of other atopic diseases, including asthma, drug allergies, stinging insect allergies, eczema, urticaria or contact dermatitis. There is no  significant infectious history. Vaccinations are up to date.    Past Medical History: Patient Active Problem List   Diagnosis Date Noted  . Seasonal and perennial allergic rhinitis 12/15/2019    Medication List:  Allergies as of 12/12/2019   No Known Allergies     Medication List       Accurate as of December 12, 2019 11:59 PM. If you have any questions, ask your nurse or doctor.        cetirizine 10 MG tablet Commonly known as: ZYRTEC Take 10 mg by mouth daily.   ferrous sulfate 325 (65 FE) MG EC tablet Take 325 mg by mouth daily.   fexofenadine 60 MG tablet Commonly known as: ALLEGRA Take 60 mg by mouth daily.   multivitamin tablet Take 1 tablet by mouth daily.   ondansetron 8 MG disintegrating tablet Commonly known as: Zofran ODT Take 1 tablet (8 mg total) by mouth every 8 (eight) hours as needed for nausea or vomiting.   oxyCODONE-acetaminophen 5-325 MG tablet Commonly known as: PERCOCET/ROXICET Take 1 tablet by mouth every 8 (eight) hours as needed for severe pain.   tamsulosin 0.4 MG Caps capsule Commonly known as: FLOMAX Take 0.4 mg by mouth daily.   Xyzal Allergy 24HR 5 MG tablet Generic drug: levocetirizine Take 5 mg by mouth every evening.       Birth History: non-contributory  Developmental History: non-contributory  Past Surgical History: Past Surgical History:  Procedure Laterality Date  . basket recovery    . EXTRACORPOREAL SHOCK WAVE LITHOTRIPSY Right 12/21/2016   Procedure: RIGHT EXTRACORPOREAL SHOCK WAVE LITHOTRIPSY (ESWL);  Surgeon: Malen Gauze, MD;  Location: [facility];  Service: Urology;  Laterality: Right;  . OTHER SURGICAL HISTORY     essure     Family History: Family History  Problem Relation Age of Onset  . ALS Father   . Kidney cancer Father   . Kidney Stones Sister   . Stroke Maternal Grandmother   . Diabetes Maternal Grandfather      Social History: Seniyah lives at home with her family.  She lives in a house  that is 54 years old.  There is hardwood with area rugs in the main living areas and carpeting in the bedrooms.  She has wooden electric heating and central cooling.  There are dogs and cats inside of the home.  There are dogs outside of the home.  She does not have dust mite covers on the bedding.  There is no tobacco exposure.  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  She is not exposed to fumes, chemicals, or dust.  She does not use a HEPA filter in the home.  She is not a smoker.   Review of Systems  Constitutional: Negative.  Negative for chills, fever, malaise/fatigue and weight loss.  HENT: Negative.  Negative for congestion, ear discharge and ear pain.   Eyes: Negative for pain, discharge and redness.  Respiratory: Negative for cough, sputum production, shortness of breath and wheezing.  Cardiovascular: Negative.  Negative for chest pain and palpitations.  Gastrointestinal: Negative for abdominal pain, heartburn, nausea and vomiting.  Skin: Negative.  Negative for itching and rash.  Neurological: Negative for dizziness and headaches.  Endo/Heme/Allergies: Positive for environmental allergies. Does not bruise/bleed easily.       Objective:   Blood pressure 118/72, pulse 92, temperature 98.3 F (36.8 C), resp. rate 17, height 5\' 6"  (1.676 m), weight 194 lb 12.8 oz (88.4 kg), SpO2 96 %. Body mass index is 31.44 kg/m.   Physical Exam:   Physical Exam Constitutional:      Appearance: She is well-developed.  HENT:     Head: Normocephalic and atraumatic.     Right Ear: Tympanic membrane, ear canal and external ear normal. No drainage, swelling or tenderness. Tympanic membrane is not injected, scarred, erythematous, retracted or bulging.     Left Ear: Tympanic membrane, ear canal and external ear normal. No drainage, swelling or tenderness. Tympanic membrane is not injected, scarred, erythematous, retracted or bulging.     Nose: No nasal deformity, septal  deviation, mucosal edema or rhinorrhea.     Right Turbinates: Enlarged, swollen and pale.     Left Turbinates: Enlarged, swollen and pale.     Right Sinus: No maxillary sinus tenderness or frontal sinus tenderness.     Left Sinus: No maxillary sinus tenderness or frontal sinus tenderness.     Comments: Clear rhinorrhea bilaterally.    Mouth/Throat:     Mouth: Mucous membranes are not pale and not dry.     Pharynx: Uvula midline.     Comments: Cobblestoning present posterior oropharynx. Eyes:     General:        Right eye: No discharge.        Left eye: No discharge.     Conjunctiva/sclera: Conjunctivae normal.     Right eye: Right conjunctiva is not injected. No chemosis.    Left eye: Left conjunctiva is not injected. No chemosis.    Pupils: Pupils are equal, round, and reactive to light.  Cardiovascular:     Rate and Rhythm: Normal rate and regular rhythm.     Heart sounds: Normal heart sounds.  Pulmonary:     Effort: Pulmonary effort is normal. No tachypnea, accessory muscle usage or respiratory distress.     Breath sounds: Normal breath sounds. No wheezing, rhonchi or rales.     Comments: Moving air well in all lung fields. Chest:     Chest wall: No tenderness.  Abdominal:     Tenderness: There is no abdominal tenderness. There is no guarding or rebound.  Lymphadenopathy:     Head:     Right side of head: No submandibular, tonsillar or occipital adenopathy.     Left side of head: No submandibular, tonsillar or occipital adenopathy.     Cervical: No cervical adenopathy.  Skin:    Coloration: Skin is not pale.     Findings: No abrasion, erythema, petechiae or rash. Rash is not papular, urticarial or vesicular.  Neurological:     Mental Status: She is alert.  Psychiatric:        Behavior: Behavior is cooperative.      Diagnostic studies:    Allergy Studies:     Airborne Adult Perc - 12/12/19 1524    Time Antigen Placed 1510    Allergen Manufacturer 12/14/19    Location  Back    Number of Test 59    Panel 1 Select    1. Control-Buffer 50%  Glycerol Negative    2. Control-Histamine 1 mg/ml 2+    3. Albumin saline Negative    4. Bahia 2+   2   5. [place] 2+    6. Johnson 2+    7. [place] Blue 3+    8. Meadow Fescue 3+    9. Perennial Rye 3+    10. Sweet Vernal 4+    11. Timothy 3+    12. Cocklebur Negative    13. Burweed Marshelder Negative    14. Ragweed, short 4+    15. Ragweed, Giant Negative    16. Plantain,  English Negative    17. Lamb's Quarters Negative    18. Sheep Sorrell Negative    19. Rough Pigweed 2+    20. Marsh Elder, Rough 2+    21. Mugwort, Common 2+    22. Ash mix Negative    23. Birch mix 2+    24. Beech American Negative    25. Box, Elder 2+    26. Cedar, red Negative    27. Cottonwood, Guinea-Bissau Negative    28. Elm mix Negative    29. Hickory Negative    30. Maple mix Negative    31. Oak, Guinea-[place] mix Negative    32. Pecan Pollen 2+    33. Pine mix Negative    34. Sycamore Eastern Negative    35. Walnut, Black Pollen Negative    36. Alternaria alternata Negative    37. Cladosporium Herbarum Negative    38. Aspergillus mix Negative    39. Penicillium mix Negative    40. Bipolaris sorokiniana (Helminthosporium) Negative    41. Drechslera spicifera (Curvularia) 2+    42. Mucor plumbeus 2+    43. Fusarium moniliforme 2+    44. Aureobasidium pullulans (pullulara) Negative    45. Rhizopus oryzae 2+    46. Botrytis cinera Negative    47. Epicoccum nigrum Negative    48. Phoma betae Negative    49. Candida Albicans Negative    50. Trichophyton mentagrophytes Negative    51. Mite, D Farinae  5,000 AU/ml 2+    52. Mite, D Pteronyssinus  5,000 AU/ml Negative    53. Cat Hair 10,000 BAU/ml --   +/-   54.  Dog Epithelia --   +/-   55. Mixed Feathers Negative    56. Horse Epithelia Negative    57. Cockroach, German Negative    58. Mouse Negative    59. Tobacco Leaf Negative          Food Adult Perc - 12/12/19 1500      Time Antigen Placed 1510    Allergen Manufacturer Waynette Buttery    Location --   Back   Number of allergen test 17    1. Peanut Negative    2. Soybean Negative    3. Wheat Negative    4. Sesame Negative    5. Milk, cow Negative    6. Egg White, Chicken Negative    7. Casein Negative    8. Shellfish Mix Negative    9. Fish Mix Negative    10. Cashew Negative    12. Walnut Food Negative    13. Almond --   +/-   14. Hazelnut --   +/-   15. [place] nut --   +/-   16. Coconut --   +/-   17. Pistachio Negative           Allergy testing results were read and interpreted by  myself, documented by clinical staff.         Salvatore Marvel, MD [facility]

## 2019-12-12 NOTE — Patient Instructions (Addendum)
1. Allergic reaction, subsequent encounter - I am not sure what is causing your reaction. - There are sulfite allergies associated with allergic reactions to alcohols, but these are typically just asthmatic symptoms and typically with preserved foods as well such as cheese and sausages. - There are also tannin sensitivities, but these are typically only with red wines. - I would avoid alcohol for now to avoid any reactions. - Testing was essentially negative to the most common foods. - Copy of test results provided. - Testing was positive to some tree nuts, but they were not large at all and likely not relevant. - EpiPen training provided just to be on the safe side. - Anaphylaxis management plan provided. - Take notes of triggers of future reactions. - We are going to get some labs to rule out serious causes of hives/allergic reactions.   2. Seasonal and perennial allergic rhinitis - Testing today showed: grasses, ragweed, weeds, trees, indoor molds, outdoor molds, dust mites, cat and dog - Copy of test results provided.  - Avoidance measures provided. - Continue with: your antihistamines daily. - I do not think that allergy shots are needed at this time, but we can consider them in the future.   3. Return in about 2 months (around 02/11/2020).    Please inform us of any Emergency Department visits, hospitalizations, or changes in symptoms. Call us before going to the ED for breathing or allergy symptoms since we might be able to fit you in for a sick visit. Feel free to contact us anytime with any questions, problems, or concerns.  It was a pleasure to meet you today!  Websites that have reliable patient information: 1. American Academy of Asthma, Allergy, and Immunology: www.aaaai.org 2. Food Allergy Research and Education (FARE): foodallergy.org 3. Mothers of Asthmatics: http://www.asthmacommunitynetwork.org 4. American College of Allergy, Asthma, and Immunology:  www.acaai.org   COVID-19 Vaccine Information can be found at: [email or web address].[email or web address] For questions related to vaccine distribution or appointments, please email [email or web address] or call [phone].     "Like" [place] on Facebook and Instagram for our latest updates!     HAPPY FALL!     Make sure you are registered to vote! If you have moved or changed any of your contact information, you will need to get this updated before voting!  In some cases, you MAY be able to register to vote online: [email or web address]

## 2019-12-15 ENCOUNTER — Encounter: Payer: Self-pay | Admitting: Allergy & Immunology

## 2019-12-15 DIAGNOSIS — J302 Other seasonal allergic rhinitis: Secondary | ICD-10-CM | POA: Insufficient documentation

## 2019-12-15 DIAGNOSIS — J3089 Other allergic rhinitis: Secondary | ICD-10-CM | POA: Insufficient documentation

## 2020-01-02 ENCOUNTER — Telehealth: Payer: Self-pay

## 2020-01-02 MED ORDER — EPINEPHRINE 0.3 MG/0.3ML IJ SOAJ: 0.3000 mg | Freq: Once | INTRAMUSCULAR | 2 refills | Status: AC

## 2020-01-02 NOTE — Telephone Encounter (Signed)
Patient called stating she didn't receive her Auvi-Q. I'm putting it in now.

## 2020-01-03 LAB — ALPHA-GAL PANEL

## 2020-01-14 LAB — CBC WITH DIFFERENTIAL
Basophils Absolute: 0.1 10*3/uL (ref 0.0–0.2)
Basos: 1 %
EOS (ABSOLUTE): 0.2 10*3/uL (ref 0.0–0.4)
Eos: 3 %
Hematocrit: 38.9 % (ref 34.0–46.6)
Hemoglobin: 13.1 g/dL (ref 11.1–15.9)
Immature Grans (Abs): 0 10*3/uL (ref 0.0–0.1)
Immature Granulocytes: 0 %
Lymphocytes Absolute: 2.3 10*3/uL (ref 0.7–3.1)
Lymphs: 37 %
MCH: 31.7 pg (ref 26.6–33.0)
MCHC: 33.7 g/dL (ref 31.5–35.7)
MCV: 94 fL (ref 79–97)
Monocytes Absolute: 0.5 10*3/uL (ref 0.1–0.9)
Monocytes: 7 %
Neutrophils Absolute: 3.3 10*3/uL (ref 1.4–7.0)
Neutrophils: 52 %
RBC: 4.13 x10E6/uL (ref 3.77–5.28)
RDW: 11.9 % (ref 11.7–15.4)
WBC: 6.3 10*3/uL (ref 3.4–10.8)

## 2020-01-14 LAB — PROTEIN ELECTROPHORESIS, SERUM
A/G Ratio: 1.2 (ref 0.7–1.7)
Albumin ELP: 3.4 g/dL (ref 2.9–4.4)
Alpha 1: 0.2 g/dL (ref 0.0–0.4)
Alpha 2: 0.8 g/dL (ref 0.4–1.0)
Beta: 1 g/dL (ref 0.7–1.3)
Gamma Globulin: 0.9 g/dL (ref 0.4–1.8)
Globulin, Total: 2.9 g/dL (ref 2.2–3.9)

## 2020-01-14 LAB — CMP14+EGFR
ALT: 19 IU/L (ref 0–32)
AST: 22 IU/L (ref 0–40)
Albumin/Globulin Ratio: 2.5 — ABNORMAL HIGH (ref 1.2–2.2)
Albumin: 4.5 g/dL (ref 3.8–4.9)
Alkaline Phosphatase: 95 IU/L (ref 44–121)
BUN/Creatinine Ratio: 25 — ABNORMAL HIGH (ref 9–23)
BUN: 13 mg/dL (ref 6–24)
Bilirubin Total: 0.2 mg/dL (ref 0.0–1.2)
CO2: 23 mmol/L (ref 20–29)
Calcium: 9.7 mg/dL (ref 8.7–10.2)
Chloride: 103 mmol/L (ref 96–106)
Creatinine, Ser: 0.51 mg/dL — ABNORMAL LOW (ref 0.57–1.00)
GFR calc Af Amer: 126 mL/min/{1.73_m2} (ref >59)
GFR calc non Af Amer: 109 mL/min/{1.73_m2} (ref >59)
Globulin, Total: 1.8 g/dL (ref 1.5–4.5)
Glucose: 87 mg/dL (ref 65–99)
Potassium: 4 mmol/L (ref 3.5–5.2)
Sodium: 142 mmol/L (ref 134–144)
Total Protein: 6.3 g/dL (ref 6.0–8.5)

## 2020-01-14 LAB — ALPHA-GAL PANEL
Alpha Gal IgE*: 2.49 kU/L — ABNORMAL HIGH (ref <0.10)
Beef (Bos spp) IgE: 0.62 kU/L — ABNORMAL HIGH (ref <0.35)
Class Interpretation: 0
Class Interpretation: 1
Lamb/Mutton (Ovis spp) IgE: <0.1 kU/L (ref <0.35)
Pork (Sus spp) IgE: 0.16 kU/L (ref <0.35)

## 2020-01-14 LAB — CHRONIC URTICARIA: cu index: 3.9 (ref <10)

## 2020-01-14 LAB — SEDIMENTATION RATE: Sed Rate: 37 mm/hr (ref 0–40)

## 2020-01-14 LAB — ANA W/REFLEX IF POSITIVE: Anti Nuclear Antibody (ANA): NEGATIVE

## 2020-01-14 LAB — METANEPHRINES, PLASMA
Metanephrine, Free: <10 pg/mL (ref 0.0–88.0)
Normetanephrine, Free: 48.9 pg/mL (ref 0.0–244.0)

## 2020-01-14 LAB — C-REACTIVE PROTEIN: CRP: 5 mg/L (ref 0–10)

## 2020-01-14 LAB — TRYPTASE: Tryptase: 5.8 ug/L (ref 2.2–13.2)

## 2020-08-04 ENCOUNTER — Other Ambulatory Visit (HOSPITAL_COMMUNITY): Payer: Self-pay | Admitting: Obstetrics and Gynecology

## 2020-08-04 DIAGNOSIS — Z1231 Encounter for screening mammogram for malignant neoplasm of breast: Secondary | ICD-10-CM

## 2020-09-02 ENCOUNTER — Other Ambulatory Visit: Payer: Self-pay

## 2020-09-02 ENCOUNTER — Ambulatory Visit (HOSPITAL_COMMUNITY)
Admission: RE | Admit: 2020-09-02 | Discharge: 2020-09-02 | Disposition: A | Discharge status: Home / Self Care | Payer: BC Managed Care – PPO | Source: Ambulatory Visit | Attending: Obstetrics and Gynecology | Admitting: Obstetrics and Gynecology

## 2020-09-02 DIAGNOSIS — Z1231 Encounter for screening mammogram for malignant neoplasm of breast: Secondary | ICD-10-CM

## 2020-12-17 DIAGNOSIS — R7303 Prediabetes: Secondary | ICD-10-CM | POA: Insufficient documentation

## 2020-12-17 DIAGNOSIS — E782 Mixed hyperlipidemia: Secondary | ICD-10-CM | POA: Insufficient documentation

## 2020-12-22 DIAGNOSIS — M25511 Pain in right shoulder: Secondary | ICD-10-CM | POA: Insufficient documentation

## 2020-12-22 DIAGNOSIS — R7301 Impaired fasting glucose: Secondary | ICD-10-CM | POA: Insufficient documentation

## 2020-12-22 DIAGNOSIS — T7840XA Allergy, unspecified, initial encounter: Secondary | ICD-10-CM | POA: Insufficient documentation

## 2020-12-22 DIAGNOSIS — Z8616 Personal history of COVID-19: Secondary | ICD-10-CM | POA: Insufficient documentation

## 2020-12-22 DIAGNOSIS — J302 Other seasonal allergic rhinitis: Secondary | ICD-10-CM | POA: Insufficient documentation

## 2020-12-22 DIAGNOSIS — N200 Calculus of kidney: Secondary | ICD-10-CM | POA: Insufficient documentation

## 2020-12-22 DIAGNOSIS — E669 Obesity, unspecified: Secondary | ICD-10-CM | POA: Insufficient documentation

## 2020-12-22 DIAGNOSIS — G629 Polyneuropathy, unspecified: Secondary | ICD-10-CM | POA: Insufficient documentation

## 2021-06-27 ENCOUNTER — Other Ambulatory Visit (HOSPITAL_COMMUNITY): Payer: Self-pay | Admitting: Internal Medicine

## 2021-06-27 DIAGNOSIS — E782 Mixed hyperlipidemia: Secondary | ICD-10-CM

## 2021-08-09 ENCOUNTER — Other Ambulatory Visit (HOSPITAL_COMMUNITY): Payer: Self-pay | Admitting: Obstetrics and Gynecology

## 2021-08-09 DIAGNOSIS — Z1231 Encounter for screening mammogram for malignant neoplasm of breast: Secondary | ICD-10-CM

## 2021-08-16 ENCOUNTER — Ambulatory Visit (HOSPITAL_COMMUNITY)
Admission: RE | Admit: 2021-08-16 | Discharge: 2021-08-16 | Disposition: A | Discharge status: Home / Self Care | Payer: BC Managed Care – PPO | Source: Ambulatory Visit | Attending: Internal Medicine | Admitting: Internal Medicine

## 2021-08-16 DIAGNOSIS — E782 Mixed hyperlipidemia: Secondary | ICD-10-CM | POA: Insufficient documentation

## 2021-09-05 ENCOUNTER — Encounter (HOSPITAL_COMMUNITY): Payer: Self-pay | Admitting: Radiology

## 2021-09-05 ENCOUNTER — Ambulatory Visit (HOSPITAL_COMMUNITY)
Admission: RE | Admit: 2021-09-05 | Discharge: 2021-09-05 | Disposition: A | Discharge status: Home / Self Care | Payer: BC Managed Care – PPO | Source: Ambulatory Visit | Attending: Obstetrics and Gynecology | Admitting: Obstetrics and Gynecology

## 2021-09-05 DIAGNOSIS — Z1231 Encounter for screening mammogram for malignant neoplasm of breast: Secondary | ICD-10-CM | POA: Diagnosis present

## 2022-06-15 ENCOUNTER — Ambulatory Visit: Payer: BC Managed Care – PPO | Admitting: Neurology

## 2022-06-15 ENCOUNTER — Ambulatory Visit: Payer: Self-pay | Admitting: Diagnostic Neuroimaging

## 2022-06-15 ENCOUNTER — Encounter: Payer: Self-pay | Admitting: Neurology

## 2022-06-15 VITALS — BP 106/72 | HR 64 | Ht 67.0 in | Wt 198.2 lb

## 2022-06-15 DIAGNOSIS — R531 Weakness: Secondary | ICD-10-CM | POA: Diagnosis not present

## 2022-06-15 DIAGNOSIS — R29898 Other symptoms and signs involving the musculoskeletal system: Secondary | ICD-10-CM | POA: Insufficient documentation

## 2022-06-15 NOTE — Progress Notes (Signed)
Chief Complaint  Patient presents with   New Patient (Initial Visit)    Rm 15, alone Upper extremity weakness/dexterity ongoing for over 1 year        ASSESSMENT AND PLAN  Tracy Serrano is a 57 y.o. female   Gradual onset bilateral upper weakness,  Moderate left more than right proximal upper extremity weakness, with relative brisk reflex, no sensory loss,  Father died of ALS at age 72  Laboratory evaluation to rule out intrinsic muscle disease  MRI of cervical spine  EMG nerve conduction study   DIAGNOSTIC DATA (LABS, IMAGING, TESTING) - I reviewed patient records, labs, notes, testing and imaging myself where available.   MEDICAL HISTORY:  Tracy Serrano, is a 57 year old female seen in request by her primary care physician Dr. Margo Aye, Jonny Ruiz for evaluation of muscle weakness  I reviewed and summarized the referring note. 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  PHYSICAL EXAMNIATION:  Gen: NAD, conversant, well nourised, well groomed                     Cardiovascular: Regular rate rhythm, no peripheral edema, warm, nontender. Eyes: Conjunctivae clear without exudates or hemorrhage Neck: Supple, no carotid bruits. Pulmonary: Clear to auscultation bilaterally   NEUROLOGICAL EXAM:  MENTAL STATUS: Speech/cognition: Awake, alert, oriented to  history taking and casual conversation CRANIAL NERVES: CN II: Visual fields are full to confrontation. Pupils are round equal and briskly reactive to light. CN III, IV, VI: extraocular movement are normal. No ptosis. CN V: Facial sensation is intact to light touch CN VII: Face is symmetric with normal eye closure  CN VIII: Hearing is normal to causal conversation. CN IX, X: Phonation is normal. CN XI: Head turning and shoulder shrug are intact  MOTOR:  UE Shoulder Abduction Shoulder External Rotation Elbow Flexion Brachioradialis Elbow  Extension Wrist Flexion Wrist Extension Grip  R 4+ L 4 4 4- 4- 4+ LE Hip Flexion Knee flexion Knee extension Ankle Dorsiflexion Eversion Ankle plantar Flexion Inversion Toe flexion/Extension  R 5/5  L 5/5     REFLEXES: Reflexes are 2  and symmetric at the biceps, triceps, knees (R2/L3), and ankles. Plantar responses are extensor bilaterally  SENSORY: Intact to light touch, pinprick and vibratory sensation are intact in fingers and toes.  COORDINATION: There is no trunk or limb dysmetria noted.  GAIT/STANCE: Posture is normal. Gait is steady with normal steps, base, arm swing, and turning. Heel and toe walking are normal. Tandem gait is normal.  Romberg is absent.  REVIEW OF SYSTEMS:  Full 14 system review of systems performed and notable only for as above All other review of  systems were negative.   ALLERGIES: Allergies  Allergen Reactions   Other Cough, Itching, Other (See Comments), Hives, Nausea Only, Rash and Swelling    Other Reaction(s): facial swelling, respiratory distress   Tree Extract Hives, Itching, Other (See Comments), Rash and Swelling    HOME MEDICATIONS: Current Outpatient Medications  Medication Sig Dispense Refill   cetirizine (ZYRTEC) 10 MG tablet Take 10 mg by mouth daily.     levocetirizine (XYZAL ALLERGY 24HR) 5 MG tablet Take 5 mg by mouth every evening.      Multiple Vitamin (MULTIVITAMIN) tablet Take 1 tablet by mouth daily.     No current facility-administered medications for this visit.    PAST MEDICAL HISTORY: Past Medical History:  Diagnosis Date   History of kidney stones    Renal disorder    kideny stones    PAST SURGICAL HISTORY: Past Surgical History:  Procedure Laterality Date   basket recovery     EXTRACORPOREAL SHOCK WAVE LITHOTRIPSY Right 12/21/2016   Procedure: RIGHT EXTRACORPOREAL SHOCK WAVE LITHOTRIPSY (ESWL);  Surgeon: Malen Gauze, MD;  Location: [facility];  Service: Urology;  Laterality: Right;   OTHER SURGICAL HISTORY     essure    FAMILY HISTORY: Family History  Problem Relation Age of Onset   ALS Father    Kidney cancer Father    Kidney Stones Sister    Stroke Maternal Grandmother    Diabetes Maternal Grandfather     SOCIAL HISTORY: Social History   Socioeconomic History   Marital status: Single    Spouse name: Not on file   Number of children: Not on file   Years of education: Not on file   Highest education level: Not on file  Occupational History   Not on file  Tobacco Use   Smoking status: Never   Smokeless tobacco: Never  Vaping Use   Vaping Use: Never used  Substance and Sexual Activity   Alcohol use: Yes   Drug use: No   Sexual activity: Yes  Other Topics Concern   Not on file  Social History Narrative   Not on file   Social Determinants of Health   Financial Resource Strain: Not on file  Food Insecurity: Not on file  Transportation Needs: Not on file  Physical Activity: Not on file  Stress: Not on file  Social Connections: Not on file  Intimate Partner Violence: Not on file      Levert Feinstein, M.D. Ph.D.  [facility] [address], [address] [place], [place] [address] Ph: [phone] Fax: [phone]  CC:  Richardean Chimera, MD [address] [place],  [place] [address]  Benita Stabile, MD

## 2022-06-16 LAB — CBC WITH DIFFERENTIAL/PLATELET
Basophils Absolute: 0.1 10*3/uL (ref 0.0–0.2)
Basos: 1 %
EOS (ABSOLUTE): 0.4 10*3/uL (ref 0.0–0.4)
Eos: 5 %
Hematocrit: 40.7 % (ref 34.0–46.6)
Hemoglobin: 13.6 g/dL (ref 11.1–15.9)
Immature Grans (Abs): 0 10*3/uL (ref 0.0–0.1)
Immature Granulocytes: 0 %
Lymphocytes Absolute: 2.5 10*3/uL (ref 0.7–3.1)
Lymphs: 29 %
MCH: 31.8 pg (ref 26.6–33.0)
MCHC: 33.4 g/dL (ref 31.5–35.7)
MCV: 95 fL (ref 79–97)
Monocytes Absolute: 0.5 10*3/uL (ref 0.1–0.9)
Monocytes: 6 %
Neutrophils Absolute: 5.2 10*3/uL (ref 1.4–7.0)
Neutrophils: 59 %
Platelets: 329 10*3/uL (ref 150–450)
RBC: 4.28 x10E6/uL (ref 3.77–5.28)
RDW: 12.4 % (ref 11.7–15.4)
WBC: 8.7 10*3/uL (ref 3.4–10.8)

## 2022-06-16 LAB — ACETYLCHOLINE RECEPTOR, BINDING: AChR Binding Ab, Serum: <0.03 nmol/L (ref 0.00–0.24)

## 2022-06-16 LAB — COMPREHENSIVE METABOLIC PANEL
ALT: 25 IU/L (ref 0–32)
AST: 26 IU/L (ref 0–40)
Albumin/Globulin Ratio: 2.4 — ABNORMAL HIGH (ref 1.2–2.2)
Albumin: 4.7 g/dL (ref 3.8–4.9)
Alkaline Phosphatase: 98 IU/L (ref 44–121)
BUN/Creatinine Ratio: 33 — ABNORMAL HIGH (ref 9–23)
BUN: 16 mg/dL (ref 6–24)
Bilirubin Total: <0.2 mg/dL (ref 0.0–1.2)
CO2: 26 mmol/L (ref 20–29)
Calcium: 9.7 mg/dL (ref 8.7–10.2)
Chloride: 102 mmol/L (ref 96–106)
Creatinine, Ser: 0.48 mg/dL — ABNORMAL LOW (ref 0.57–1.00)
Globulin, Total: 2 g/dL (ref 1.5–4.5)
Glucose: 105 mg/dL — ABNORMAL HIGH (ref 70–99)
Potassium: 3.9 mmol/L (ref 3.5–5.2)
Sodium: 142 mmol/L (ref 134–144)
Total Protein: 6.7 g/dL (ref 6.0–8.5)
eGFR: 111 mL/min/{1.73_m2} (ref >59)

## 2022-06-16 LAB — FOLATE: Folate: >20 ng/mL (ref >3.0)

## 2022-06-16 LAB — CK: Total CK: 370 U/L — ABNORMAL HIGH (ref 32–182)

## 2022-06-16 LAB — VITAMIN B12: Vitamin B-12: 420 pg/mL (ref 232–1245)

## 2022-06-16 LAB — RPR: RPR Ser Ql: NONREACTIVE

## 2022-06-16 LAB — C-REACTIVE PROTEIN: CRP: 4 mg/L (ref 0–10)

## 2022-06-16 LAB — VITAMIN D 25 HYDROXY (VIT D DEFICIENCY, FRACTURES): Vit D, 25-Hydroxy: 28.4 ng/mL — ABNORMAL LOW (ref 30.0–100.0)

## 2022-06-16 LAB — SEDIMENTATION RATE: Sed Rate: 7 mm/hr (ref 0–40)

## 2022-06-16 LAB — ANA W/REFLEX IF POSITIVE: Anti Nuclear Antibody (ANA): NEGATIVE

## 2022-06-16 LAB — HIV ANTIBODY (ROUTINE TESTING W REFLEX): HIV Screen 4th Generation wRfx: NONREACTIVE

## 2022-06-16 LAB — TSH: TSH: 3.72 u[IU]/mL (ref 0.450–4.500)

## 2022-06-21 ENCOUNTER — Telehealth: Payer: Self-pay | Admitting: Neurology

## 2022-06-21 NOTE — Telephone Encounter (Signed)
Yetta Numbers: [id number] exp. 06/19/22-07/18/22 sent to [facility] [phone]

## 2022-08-01 NOTE — Telephone Encounter (Signed)
Yetta Numbers: [id number] exp. 08/01/22-08/30/22 for Tracy Serrano.

## 2022-08-03 ENCOUNTER — Ambulatory Visit (HOSPITAL_COMMUNITY)
Admission: RE | Admit: 2022-08-03 | Discharge: 2022-08-03 | Disposition: A | Discharge status: Home / Self Care | Payer: BC Managed Care – PPO | Source: Ambulatory Visit | Attending: Neurology | Admitting: Neurology

## 2022-08-03 DIAGNOSIS — R29898 Other symptoms and signs involving the musculoskeletal system: Secondary | ICD-10-CM | POA: Diagnosis present

## 2022-08-03 DIAGNOSIS — R531 Weakness: Secondary | ICD-10-CM | POA: Diagnosis present

## 2022-08-16 ENCOUNTER — Emergency Department (HOSPITAL_COMMUNITY)
Admission: EM | Admit: 2022-08-16 | Discharge: 2022-08-16 | Disposition: A | Discharge status: Home / Self Care | Payer: BC Managed Care – PPO | Attending: Emergency Medicine | Admitting: Emergency Medicine

## 2022-08-16 ENCOUNTER — Other Ambulatory Visit: Payer: Self-pay

## 2022-08-16 ENCOUNTER — Encounter (HOSPITAL_COMMUNITY): Payer: Self-pay

## 2022-08-16 ENCOUNTER — Emergency Department (HOSPITAL_COMMUNITY): Payer: BC Managed Care – PPO

## 2022-08-16 DIAGNOSIS — R1031 Right lower quadrant pain: Secondary | ICD-10-CM | POA: Diagnosis present

## 2022-08-16 DIAGNOSIS — N201 Calculus of ureter: Secondary | ICD-10-CM | POA: Diagnosis not present

## 2022-08-16 LAB — BASIC METABOLIC PANEL
Anion gap: 10 (ref 5–15)
BUN: 19 mg/dL (ref 6–20)
CO2: 23 mmol/L (ref 22–32)
Calcium: 9 mg/dL (ref 8.9–10.3)
Chloride: 104 mmol/L (ref 98–111)
Creatinine, Ser: 0.81 mg/dL (ref 0.44–1.00)
GFR, Estimated: >60 mL/min (ref >60)
Glucose, Bld: 119 mg/dL — ABNORMAL HIGH (ref 70–99)
Potassium: 3.9 mmol/L (ref 3.5–5.1)
Sodium: 137 mmol/L (ref 135–145)

## 2022-08-16 LAB — URINALYSIS, ROUTINE W REFLEX MICROSCOPIC
Bilirubin Urine: NEGATIVE
Glucose, UA: NEGATIVE mg/dL
Ketones, ur: 20 mg/dL — AB
Nitrite: NEGATIVE
Protein, ur: 30 mg/dL — AB
Specific Gravity, Urine: 1.018 (ref 1.005–1.030)
pH: 8 (ref 5.0–8.0)

## 2022-08-16 LAB — CBC
HCT: 35.8 % — ABNORMAL LOW (ref 36.0–46.0)
Hemoglobin: 12.3 g/dL (ref 12.0–15.0)
MCH: 32.3 pg (ref 26.0–34.0)
MCHC: 34.4 g/dL (ref 30.0–36.0)
MCV: 94 fL (ref 80.0–100.0)
Platelets: 303 10*3/uL (ref 150–400)
RBC: 3.81 MIL/uL — ABNORMAL LOW (ref 3.87–5.11)
RDW: 12.6 % (ref 11.5–15.5)
WBC: 14 10*3/uL — ABNORMAL HIGH (ref 4.0–10.5)
nRBC: 0 % (ref 0.0–0.2)

## 2022-08-16 MED ORDER — ONDANSETRON HCL 4 MG PO TABS
4.0000 mg | ORAL_TABLET | Freq: Four times a day (QID) | ORAL | 0 refills | Status: DC
Start: 2022-08-16 — End: 2022-09-18

## 2022-08-16 MED ORDER — OXYCODONE-ACETAMINOPHEN 5-325 MG PO TABS: 1.0000 | ORAL_TABLET | Freq: Four times a day (QID) | ORAL | 0 refills | Status: DC | PRN

## 2022-08-16 MED ORDER — ONDANSETRON HCL 4 MG/2ML IJ SOLN
4.0000 mg | Freq: Once | INTRAMUSCULAR | Status: AC
  Administered 2022-08-16: 4 mg via INTRAVENOUS
  Filled 2022-08-16: qty 2

## 2022-08-16 MED ORDER — FENTANYL CITRATE PF 50 MCG/ML IJ SOSY
50.0000 ug | PREFILLED_SYRINGE | Freq: Once | INTRAMUSCULAR | Status: AC
  Administered 2022-08-16: 50 ug via INTRAVENOUS
  Filled 2022-08-16: qty 1

## 2022-08-16 MED ORDER — OXYCODONE-ACETAMINOPHEN 5-325 MG PO TABS: 1.0000 | ORAL_TABLET | ORAL | 0 refills | Status: DC | PRN

## 2022-08-16 MED ORDER — KETOROLAC TROMETHAMINE 30 MG/ML IJ SOLN
30.0000 mg | Freq: Once | INTRAMUSCULAR | Status: AC
  Administered 2022-08-16: 30 mg via INTRAVENOUS
  Filled 2022-08-16: qty 1

## 2022-08-16 NOTE — Discharge Instructions (Addendum)
Your CT scan this evening shows you have a large left-sided kidney stone.As discussed, please contact Dr. Dimas Millin office tomorrow toArrange follow up. Return to the emergency department for any new or worsening symptoms.

## 2022-08-16 NOTE — ED Triage Notes (Signed)
Pt comes in with lt flank pain. Pt states lt flank pain starting this morning around 0800. Pt states hx of kidney stones and this pain feels the same.

## 2022-08-16 NOTE — ED Provider Notes (Signed)
[place] [facility] AT [facility] Provider Note   CSN: [id number] Arrival date & time: 08/16/22  1413     History  Chief Complaint  Patient presents with   Flank Pain    Tracy Serrano is a 57 y.o. female.   Flank Pain Associated symptoms include abdominal pain. Pertinent negatives include no chest pain, no headaches and no shortness of breath.        Tracy Serrano is a 57 y.o. female with medical history of recurrent kidney stones who presents to the Emergency Department complaining of left-sided flank pain that began around 830 this morning.  Pain worse to the flank but also feels like it radiates into her left groin and left lower abdomen.  She does also have some epigastric discomfort as well.  She noticed that her urine appeared cloudy today.  She denies any burning or increased urinary frequency.  No fever or chills, nausea or vomiting.  Last kidney stone was 2018.   Home Medications Prior to Admission medications   Medication Sig Start Date End Date Taking? Authorizing Provider  cetirizine (ZYRTEC) 10 MG tablet Take 10 mg by mouth daily.    [provider]  levocetirizine (XYZAL ALLERGY 24HR) 5 MG tablet Take 5 mg by mouth every evening.    [provider]  Multiple Vitamin (MULTIVITAMIN) tablet Take 1 tablet by mouth daily.    [provider]      Allergies    Other and Tree extract    Review of Systems   Review of Systems  Constitutional:  Negative for appetite change, chills and fever.  Respiratory:  Negative for shortness of breath.   Cardiovascular:  Negative for chest pain.  Gastrointestinal:  Positive for abdominal pain. Negative for nausea and vomiting.  Genitourinary:  Positive for flank pain. Negative for difficulty urinating, dysuria, hematuria, vaginal bleeding and vaginal discharge.  Neurological:  Negative for dizziness and headaches.    Physical Exam Updated Vital Signs BP 129/77   Pulse  (!) 56   Temp 98.8 F (37.1 C)   Resp 20   Ht 5\' 7"  (1.702 m)   Wt 88.5 kg   LMP 01/01/2017   SpO2 100%   BMI 30.54 kg/m  Physical Exam Vitals and nursing note reviewed.  Constitutional:      General: She is not in acute distress.    Appearance: Normal appearance. She is not ill-appearing or toxic-appearing.  Cardiovascular:     Rate and Rhythm: Normal rate and regular rhythm.     Pulses: Normal pulses.  Pulmonary:     Effort: Pulmonary effort is normal. No respiratory distress.  Abdominal:     General: There is no distension.     Palpations: Abdomen is soft.     Tenderness: There is no abdominal tenderness. There is no right CVA tenderness or left CVA tenderness.  Musculoskeletal:     Right lower leg: No edema.     Left lower leg: No edema.  Skin:    General: Skin is warm.     Capillary Refill: Capillary refill takes less than 2 seconds.  Neurological:     General: No focal deficit present.     Mental Status: She is alert.     Sensory: No sensory deficit.     Motor: No weakness.     ED Results / Procedures / Treatments   Labs (all labs ordered are listed, but only abnormal results are displayed) Labs Reviewed  URINALYSIS, ROUTINE  W REFLEX MICROSCOPIC - Abnormal; Notable for the following components:      Result Value   APPearance CLOUDY (*)    Hgb urine dipstick SMALL (*)    Ketones, ur 20 (*)    Protein, ur 30 (*)    Leukocytes,Ua TRACE (*)    Bacteria, UA RARE (*)    All other components within normal limits  BASIC METABOLIC PANEL - Abnormal; Notable for the following components:   Glucose, Bld 119 (*)    All other components within normal limits  CBC - Abnormal; Notable for the following components:   WBC 14.0 (*)    RBC 3.81 (*)    HCT 35.8 (*)    All other components within normal limits    EKG None  Radiology CT Renal Stone Study  Result Date: 08/16/2022 CLINICAL DATA:  Abdominal and flank pain on the left. Stone disease suspected. EXAM: CT  ABDOMEN AND PELVIS WITHOUT CONTRAST TECHNIQUE: Multidetector CT imaging of the abdomen and pelvis was performed following the standard protocol without IV contrast. RADIATION DOSE REDUCTION: This exam was performed according to the departmental dose-optimization program which includes automated exposure control, adjustment of the mA and/or kV according to patient size and/or use of iterative reconstruction technique. COMPARISON:  Ultrasound 11/19/2015.  CT 06/19/2010. FINDINGS: Lower chest: Lung bases are clear.  No pleural or pericardial fluid. Hepatobiliary: Liver parenchyma is normal without contrast. No calcified gallstones. Pancreas: Normal Spleen: Normal Adrenals/Urinary Tract: Adrenal glands are normal. Right kidney contain several small nonobstructing stones, the largest in the lower pole measuring 5 mm. On the left, there is hydroureteronephrosis and renal swelling. There is several nonobstructing stones in the left kidney, the largest in the lower pole measuring 6 x 7 mm. The ureter is dilated to the level of the lower pole of the kidney, where there is an obstructing stone measuring up to 12 mm in size. No stone seen distal to that. No stone in the bladder. Stomach/Bowel: Stomach and small intestine are normal. No abnormal colon finding. Vascular/Lymphatic: Aorta and IVC are normal.  No adenopathy. Reproductive: No pelvic mass.  Bilateral fallopian tube wires. Other: No free fluid or air. Musculoskeletal: Ordinary lower lumbar degenerative changes. Old superior endplate Schmorl's node at L1. IMPRESSION: 1. Hydroureteronephrosis on the left secondary to a 12 mm stone in the proximal ureter at the level of the lower pole of the left kidney. 2. Several nonobstructing stones in both kidneys, the largest in the lower pole of the left kidney measuring 6 x 7 mm. Electronically Signed   By: Paulina Fusi M.D.   On: 08/16/2022 16:38    Procedures Procedures    Medications Ordered in ED Medications   ondansetron (ZOFRAN) injection 4 mg (has no administration in time range)  fentaNYL (SUBLIMAZE) injection 50 mcg (has no administration in time range)    ED Course/ Medical Decision Making/ A&P                             Medical Decision Making Patient here with gradually worsening left flank pain and history of kidney stones.  Pain began this morning shortly after waking.  Describes pain to the flank that radiates into her groin and lower abdomen.  She notes a cloudy appearance of urine today but denies any dysuria symptoms.  No nausea vomiting fever or chills.  States pain feels similar to prior kidney stones.  Differential includes but not limited to recurrent  kidney stone, infected stone, renal colic, pyelonephritis, musculoskeletal  On review of prior medical records,Patient Underwent lithotripsy in 2018 by Dr. Ronne Binning  Amount and/or Complexity of Data Reviewed Labs: ordered.    Details: Labs interpreted by me,Leukocytosis with white count of 14,000, hemoglobin unremarkable, chemistries withoutDuringUrinalysis shows cloudy urine with small hemoglobinSmall proteinuria trace leukocytesAnd rare bacteria.  Urine culture pending Radiology: ordered.    Details: CT renal stone study ordered for further evaluation, shows Hydroureteronephrosis on the left secondary to a 12 mm stone in the proximal ureter at the level of the lower pole of the left kidney Several other nonobstructing stones in both kidneys Discussion of management or test interpretation with external provider(s): On recheck, patient resting comfortably.Pain has improved although not resolved.Initially givenNarcotic pain medication, will try ToradolAfter review of kidney functions.No nausea or vomiting at present.  CT renal stone study shows large 12 mm obstructing stone.  Will consult urology  On repeat recheck, patient now reporting pain has resolved, continues to rest comfortably.  Vital signs reassuring   Consulted with  urology, Dr. Laverle Patter who recommends, if patient's pain is controlled, she can contact Dr. Dimas Millin office tomorrow to arrange follow-up appointment for likely lithotripsy  Patient is agreeable to this plan, she is resting comfortably.  Pain has improved after receiving IV Toradol.  Risk Prescription drug management.           Final Clinical Impression(s) / ED Diagnoses Final diagnoses:  Ureterolithiasis    Rx / DC Orders ED Discharge Orders     None         Pauline Aus, PA-C 08/18/22 1556    Terrilee Files, MD 08/19/22 1017

## 2022-08-16 NOTE — ED Notes (Signed)
Patient transported to CT 

## 2022-08-17 ENCOUNTER — Other Ambulatory Visit: Payer: Self-pay

## 2022-08-17 DIAGNOSIS — N200 Calculus of kidney: Secondary | ICD-10-CM

## 2022-08-17 MED FILL — Hydrocodone-Acetaminophen Tab 5-325 MG: ORAL | Qty: 6 | Status: AC

## 2022-08-18 ENCOUNTER — Ambulatory Visit (HOSPITAL_COMMUNITY)
Admission: RE | Admit: 2022-08-18 | Discharge: 2022-08-18 | Disposition: A | Discharge status: Home / Self Care | Payer: BC Managed Care – PPO | Source: Ambulatory Visit | Attending: Urology | Admitting: Urology

## 2022-08-18 ENCOUNTER — Ambulatory Visit: Payer: BC Managed Care – PPO | Admitting: Urology

## 2022-08-18 VITALS — BP 118/83 | HR 73

## 2022-08-18 DIAGNOSIS — N202 Calculus of kidney with calculus of ureter: Secondary | ICD-10-CM

## 2022-08-18 DIAGNOSIS — N200 Calculus of kidney: Secondary | ICD-10-CM

## 2022-08-18 LAB — URINALYSIS, ROUTINE W REFLEX MICROSCOPIC
Bilirubin, UA: NEGATIVE
Glucose, UA: NEGATIVE
Ketones, UA: NEGATIVE
Nitrite, UA: NEGATIVE
Protein,UA: NEGATIVE
Specific Gravity, UA: 1.015 (ref 1.005–1.030)
Urobilinogen, Ur: 0.2 mg/dL (ref 0.2–1.0)
pH, UA: 6 (ref 5.0–7.5)

## 2022-08-18 LAB — URINE CULTURE

## 2022-08-18 LAB — MICROSCOPIC EXAMINATION
Bacteria, UA: NONE SEEN
RBC, Urine: >30 /hpf — AB (ref 0–2)

## 2022-08-18 MED ORDER — ONDANSETRON HCL 4 MG PO TABS: 4.0000 mg | ORAL_TABLET | Freq: Three times a day (TID) | ORAL | 0 refills | Status: DC | PRN

## 2022-08-18 MED ORDER — OXYCODONE-ACETAMINOPHEN 5-325 MG PO TABS: 1.0000 | ORAL_TABLET | Freq: Four times a day (QID) | ORAL | 0 refills | Status: DC | PRN

## 2022-08-18 MED ORDER — TAMSULOSIN HCL 0.4 MG PO CAPS: 0.4000 mg | ORAL_CAPSULE | Freq: Every day | ORAL | 0 refills | Status: DC

## 2022-08-18 MED ORDER — OXYCODONE-ACETAMINOPHEN 5-325 MG PO TABS: 1.0000 | ORAL_TABLET | ORAL | 0 refills | Status: DC | PRN

## 2022-08-18 NOTE — Progress Notes (Unsigned)
 08/18/2022 9:38 AM   Tracy Serrano 10/13/1965 [id number]  Referring provider: Hall, John Z, MD [address] [place],  [place] [address]  Nephrolithiasis   HPI: Tracy Serrano is a 56yo here for evaluation of nephrolithiasis. Start 1 week she developed left flank pain. She underwent CT which showed a 12mm left proximal ureteral calculus. KUB from today shows a 12mm left proximal ureteral calculus and a left lower pole calculus.    PMH: Past Medical History:  Diagnosis Date   History of kidney stones    Renal disorder    kideny stones    Surgical History: Past Surgical History:  Procedure Laterality Date   basket recovery     EXTRACORPOREAL SHOCK WAVE LITHOTRIPSY Right 12/21/2016   Procedure: RIGHT EXTRACORPOREAL SHOCK WAVE LITHOTRIPSY (ESWL);  Surgeon: Riker Collier L, MD;  Location: [facility];  Service: Urology;  Laterality: Right;   OTHER SURGICAL HISTORY     essure    Home Medications:  Allergies as of 08/18/2022       Reactions   Other Cough, Itching, Other (See Comments), Hives, Nausea Only, Rash, Swelling   Other Reaction(s): facial swelling, respiratory distress   Tree Extract Hives, Itching, Other (See Comments), Rash, Swelling        Medication List        Accurate as of August 18, 2022  9:38 AM. If you have any questions, ask your nurse or doctor.          cetirizine 10 MG tablet Commonly known as: ZYRTEC Take 10 mg by mouth daily.   multivitamin tablet Take 1 tablet by mouth daily.   ondansetron 4 MG tablet Commonly known as: ZOFRAN Take 1 tablet (4 mg total) by mouth every 6 (six) hours. As needed for nausea vomiting   oxyCODONE-acetaminophen 5-325 MG tablet Commonly known as: PERCOCET/ROXICET Take 1 tablet by mouth every 6 (six) hours as needed for severe pain.   oxyCODONE-acetaminophen 5-325 MG tablet Commonly known as: PERCOCET/ROXICET Take 1 tablet by mouth every 4 (four) hours as needed.   Xyzal Allergy 24HR 5 MG  tablet Generic drug: levocetirizine Take 5 mg by mouth every evening.        Allergies:  Allergies  Allergen Reactions   Other Cough, Itching, Other (See Comments), Hives, Nausea Only, Rash and Swelling    Other Reaction(s): facial swelling, respiratory distress   Tree Extract Hives, Itching, Other (See Comments), Rash and Swelling    Family History: Family History  Problem Relation Age of Onset   ALS Father    Kidney cancer Father    Kidney Stones Sister    Stroke Maternal Grandmother    Diabetes Maternal Grandfather     Social History:  reports that she has never smoked. She has never used smokeless tobacco. She reports current alcohol use. She reports that she does not use drugs.  ROS: All other review of systems were reviewed and are negative except what is noted above in HPI  Physical Exam: BP 118/83   Pulse 73   LMP 01/01/2017   Constitutional:  Alert and oriented, No acute distress. HEENT: [place] AT, moist mucus membranes.  Trachea midline, no masses. Cardiovascular: No clubbing, cyanosis, or edema. Respiratory: Normal respiratory effort, no increased work of breathing. GI: Abdomen is soft, nontender, nondistended, no abdominal masses GU: No CVA tenderness.  Lymph: No cervical or inguinal lymphadenopathy. Skin: No rashes, bruises or suspicious lesions. Neurologic: Grossly intact, no focal deficits, moving all 4 extremities. Psychiatric: Normal   mood and affect.  Laboratory Data: Lab Results  Component Value Date   WBC 14.0 (H) 08/16/2022   HGB 12.3 08/16/2022   HCT 35.8 (L) 08/16/2022   MCV 94.0 08/16/2022   PLT 303 08/16/2022    Lab Results  Component Value Date   CREATININE 0.81 08/16/2022    No results found for: "PSA"  No results found for: "TESTOSTERONE"  No results found for: "HGBA1C"  Urinalysis    Component Value Date/Time   COLORURINE YELLOW 08/16/2022 1447   APPEARANCEUR CLOUDY (A) 08/16/2022 1447   LABSPEC 1.018 08/16/2022 1447    PHURINE 8.0 08/16/2022 1447   GLUCOSEU NEGATIVE 08/16/2022 1447   HGBUR SMALL (A) 08/16/2022 1447   BILIRUBINUR NEGATIVE 08/16/2022 1447   KETONESUR 20 (A) 08/16/2022 1447   PROTEINUR 30 (A) 08/16/2022 1447   UROBILINOGEN 0.2 03/30/2013 1911   NITRITE NEGATIVE 08/16/2022 1447   LEUKOCYTESUR TRACE (A) 08/16/2022 1447    Lab Results  Component Value Date   BACTERIA RARE (A) 08/16/2022    Pertinent Imaging: Ct 08/16/2022 and KUB today: Images reviewed and discussed with the patient  Results for orders placed during the hospital encounter of 08/07/18  DG Abd 1 View  Narrative CLINICAL DATA:  Kidney stones.  EXAM: ABDOMEN - 1 VIEW  COMPARISON:  No recent  FINDINGS: Soft tissue structures are unremarkable. Tiny right renal stones are again noted in unchanged. Tiny left renal stones can not be excluded. Overlying bowel gas and stool makes evaluation for renal stone disease difficult. Pelvic calcifications are again noted. Be in stable position. These most likely phleboliths. Distal ureteral stones cannot be excluded. Bilateral Essure devices noted. Diffuse osteopenia degenerative change.  IMPRESSION: Findings consistent tiny bilateral renal stones. No interim change from prior exam.   Electronically Signed By: Thomas  Register On: 08/07/2018 15:32  Results for orders placed during the hospital encounter of 01/31/10  US VenoUS Imaging Bilateral  Narrative Clinical Data: Left leg swelling  BILATERAL LOWER EXTREMITY VENOUS DUPLEX ULTRASOUND  Technique:  Gray-scale sonography with graded compression, as well as color Doppler and duplex ultrasound, were performed to evaluate the deep venous system of both lower extremities from the level of the common femoral vein through the popliteal and proximal calf veins.  Spectral Doppler was utilized to evaluate flow at rest and with distal augmentation maneuvers.  Comparison:  None.  Findings:  Normal compressibility of  bilateral common femoral, superficial femoral, and popliteal veins is demonstrated, as well as the visualized proximal calf veins.  No filling defects to suggest DVT on grayscale or color Doppler imaging.  Doppler waveforms show normal direction of venous flow, normal respiratory phasicity and response to augmentation.  IMPRESSION: No evidence of bilateral lower extremity deep vein thrombosis.  Provider: Richard Orta  No results found for this or any previous visit.  No results found for this or any previous visit.  Results for orders placed during the hospital encounter of 11/19/15  US Renal  Narrative CLINICAL DATA:  Follow-up of kidney stones  EXAM: RENAL / URINARY TRACT ULTRASOUND COMPLETE  COMPARISON:  Renal ultrasound of September 10, 2015 as well as KUB of the same day.  FINDINGS: Right Kidney:  Length: 11.2 cm. The renal cortical echotexture remains lower than that of the adjacent liver. There are 2 echogenic shadowing stones in the lower pole. The largest measures 1.6 cm. The smaller stone measures approximately 0.5 cm. There is no hydronephrosis.  Left Kidney:  Length: 12.1 cm. The renal cortical echotexture is   normal. No echogenic shadowing stones are observed. There is no hydronephrosis.  Bladder:  Appears normal for degree of bladder distention.  IMPRESSION: At least 2 nonobstructing stones are present in the lower pole of the right kidney. There is no hydronephrosis. No stones are observed on the left.   Electronically Signed By: David  Jordan M.D. On: 11/19/2015 15:00  No valid procedures specified. No results found for this or any previous visit.  Results for orders placed during the hospital encounter of 08/16/22  CT Renal Stone Study  Narrative CLINICAL DATA:  Abdominal and flank pain on the left. Stone disease suspected.  EXAM: CT ABDOMEN AND PELVIS WITHOUT CONTRAST  TECHNIQUE: Multidetector CT imaging of the abdomen and pelvis was  performed following the standard protocol without IV contrast.  RADIATION DOSE REDUCTION: This exam was performed according to the departmental dose-optimization program which includes automated exposure control, adjustment of the mA and/or kV according to patient size and/or use of iterative reconstruction technique.  COMPARISON:  Ultrasound 11/19/2015.  CT 06/19/2010.  FINDINGS: Lower chest: Lung bases are clear.  No pleural or pericardial fluid.  Hepatobiliary: Liver parenchyma is normal without contrast. No calcified gallstones.  Pancreas: Normal  Spleen: Normal  Adrenals/Urinary Tract: Adrenal glands are normal. Right kidney contain several small nonobstructing stones, the largest in the lower pole measuring 5 mm. On the left, there is hydroureteronephrosis and renal swelling. There is several nonobstructing stones in the left kidney, the largest in the lower pole measuring 6 x 7 mm. The ureter is dilated to the level of the lower pole of the kidney, where there is an obstructing stone measuring up to 12 mm in size. No stone seen distal to that. No stone in the bladder.  Stomach/Bowel: Stomach and small intestine are normal. No abnormal colon finding.  Vascular/Lymphatic: Aorta and IVC are normal.  No adenopathy.  Reproductive: No pelvic mass.  Bilateral fallopian tube wires.  Other: No free fluid or air.  Musculoskeletal: Ordinary lower lumbar degenerative changes. Old superior endplate Schmorl's node at L1.  IMPRESSION: 1. Hydroureteronephrosis on the left secondary to a 12 mm stone in the proximal ureter at the level of the lower pole of the left kidney. 2. Several nonobstructing stones in both kidneys, the largest in the lower pole of the left kidney measuring 6 x 7 mm.   Electronically Signed By: Mark  Shogry M.D. On: 08/16/2022 16:38   Assessment & Plan:    1. Kidney stones -We discussed the management of kidney stones. These options include  observation, ureteroscopy, shockwave lithotripsy (ESWL) and percutaneous nephrolithotomy (PCNL). We discussed which options are relevant to the patient's stone(s). We discussed the natural history of kidney stones as well as the complications of untreated stones and the impact on quality of life without treatment as well as with each of the above listed treatments. We also discussed the efficacy of each treatment in its ability to clear the stone burden. With any of these management options I discussed the signs and symptoms of infection and the need for emergent treatment should these be experienced. For each option we discussed the ability of each procedure to clear the patient of their stone burden.   For observation I described the risks which include but are not limited to silent renal damage, life-threatening infection, need for emergent surgery, failure to pass stone and pain.   For ureteroscopy I described the risks which include bleeding, infection, damage to contiguous structures, positioning injury, ureteral stricture, ureteral avulsion, ureteral   injury, need for prolonged ureteral stent, inability to perform ureteroscopy, need for an interval procedure, inability to clear stone burden, stent discomfort/pain, heart attack, stroke, pulmonary embolus and the inherent risks with general anesthesia.   For shockwave lithotripsy I described the risks which include arrhythmia, kidney contusion, kidney hemorrhage, need for transfusion, pain, inability to adequately break up stone, inability to pass stone fragments, Steinstrasse, infection associated with obstructing stones, need for alternate surgical procedure, need for repeat shockwave lithotripsy, MI, CVA, PE and the inherent risks with anesthesia/conscious sedation.   For PCNL I described the risks including positioning injury, pneumothorax, hydrothorax, need for chest tube, inability to clear stone burden, renal laceration, arterial venous fistula or  malformation, need for embolization of kidney, loss of kidney or renal function, need for repeat procedure, need for prolonged nephrostomy tube, ureteral avulsion, MI, CVA, PE and the inherent risks of general anesthesia.   - The patient would like to proceed with left ESWL - Urinalysis, Routine w reflex microscopic   No follow-ups on file.  Jashon Ishida, MD  [facility] [place]   

## 2022-08-18 NOTE — H&P (View-Only) (Signed)
08/18/2022 9:38 AM   Tracy Serrano 10-Feb-1966 [id number]  Referring provider: Benita Stabile, MD [address] [place],  [place] [address]  Nephrolithiasis   HPI: Tracy Serrano is a 56yo here for evaluation of nephrolithiasis. Start 1 week she developed left flank pain. She underwent CT which showed a 12mm left proximal ureteral calculus. KUB from today shows a 12mm left proximal ureteral calculus and a left lower pole calculus.    PMH: Past Medical History:  Diagnosis Date   History of kidney stones    Renal disorder    kideny stones    Surgical History: Past Surgical History:  Procedure Laterality Date   basket recovery     EXTRACORPOREAL SHOCK WAVE LITHOTRIPSY Right 12/21/2016   Procedure: RIGHT EXTRACORPOREAL SHOCK WAVE LITHOTRIPSY (ESWL);  Surgeon: Malen Gauze, MD;  Location: [facility];  Service: Urology;  Laterality: Right;   OTHER SURGICAL HISTORY     essure    Home Medications:  Allergies as of 08/18/2022       Reactions   Other Cough, Itching, Other (See Comments), Hives, Nausea Only, Rash, Swelling   Other Reaction(s): facial swelling, respiratory distress   Tree Extract Hives, Itching, Other (See Comments), Rash, Swelling        Medication List        Accurate as of August 18, 2022  9:38 AM. If you have any questions, ask your nurse or doctor.          cetirizine 10 MG tablet Commonly known as: ZYRTEC Take 10 mg by mouth daily.   multivitamin tablet Take 1 tablet by mouth daily.   ondansetron 4 MG tablet Commonly known as: ZOFRAN Take 1 tablet (4 mg total) by mouth every 6 (six) hours. As needed for nausea vomiting   oxyCODONE-acetaminophen 5-325 MG tablet Commonly known as: PERCOCET/ROXICET Take 1 tablet by mouth every 6 (six) hours as needed for severe pain.   oxyCODONE-acetaminophen 5-325 MG tablet Commonly known as: PERCOCET/ROXICET Take 1 tablet by mouth every 4 (four) hours as needed.   Xyzal Allergy 24HR 5 MG  tablet Generic drug: levocetirizine Take 5 mg by mouth every evening.        Allergies:  Allergies  Allergen Reactions   Other Cough, Itching, Other (See Comments), Hives, Nausea Only, Rash and Swelling    Other Reaction(s): facial swelling, respiratory distress   Tree Extract Hives, Itching, Other (See Comments), Rash and Swelling    Family History: Family History  Problem Relation Age of Onset   ALS Father    Kidney cancer Father    Kidney Stones Sister    Stroke Maternal Grandmother    Diabetes Maternal Grandfather     Social History:  reports that she has never smoked. She has never used smokeless tobacco. She reports current alcohol use. She reports that she does not use drugs.  ROS: All other review of systems were reviewed and are negative except what is noted above in HPI  Physical Exam: BP 118/83   Pulse 73   LMP 01/01/2017   Constitutional:  Alert and oriented, No acute distress. HEENT: [place] AT, moist mucus membranes.  Trachea midline, no masses. Cardiovascular: No clubbing, cyanosis, or edema. Respiratory: Normal respiratory effort, no increased work of breathing. GI: Abdomen is soft, nontender, nondistended, no abdominal masses GU: No CVA tenderness.  Lymph: No cervical or inguinal lymphadenopathy. Skin: No rashes, bruises or suspicious lesions. Neurologic: Grossly intact, no focal deficits, moving all 4 extremities. Psychiatric: Normal  mood and affect.  Laboratory Data: Lab Results  Component Value Date   WBC 14.0 (H) 08/16/2022   HGB 12.3 08/16/2022   HCT 35.8 (L) 08/16/2022   MCV 94.0 08/16/2022   PLT 303 08/16/2022    Lab Results  Component Value Date   CREATININE 0.81 08/16/2022    No results found for: "PSA"  No results found for: "TESTOSTERONE"  No results found for: "HGBA1C"  Urinalysis    Component Value Date/Time   COLORURINE YELLOW 08/16/2022 1447   APPEARANCEUR CLOUDY (A) 08/16/2022 1447   LABSPEC 1.018 08/16/2022 1447    PHURINE 8.0 08/16/2022 1447   GLUCOSEU NEGATIVE 08/16/2022 1447   HGBUR SMALL (A) 08/16/2022 1447   BILIRUBINUR NEGATIVE 08/16/2022 1447   KETONESUR 20 (A) 08/16/2022 1447   PROTEINUR 30 (A) 08/16/2022 1447   UROBILINOGEN 0.2 03/30/2013 1911   NITRITE NEGATIVE 08/16/2022 1447   LEUKOCYTESUR TRACE (A) 08/16/2022 1447    Lab Results  Component Value Date   BACTERIA RARE (A) 08/16/2022    Pertinent Imaging: Ct 08/16/2022 and KUB today: Images reviewed and discussed with the patient  Results for orders placed during the hospital encounter of 08/07/18  DG Abd 1 View  Narrative CLINICAL DATA:  Kidney stones.  EXAM: ABDOMEN - 1 VIEW  COMPARISON:  No recent  FINDINGS: Soft tissue structures are unremarkable. Tiny right renal stones are again noted in unchanged. Tiny left renal stones can not be excluded. Overlying bowel gas and stool makes evaluation for renal stone disease difficult. Pelvic calcifications are again noted. Be in stable position. These most likely phleboliths. Distal ureteral stones cannot be excluded. Bilateral Essure devices noted. Diffuse osteopenia degenerative change.  IMPRESSION: Findings consistent tiny bilateral renal stones. No interim change from prior exam.   Electronically Signed By: Maisie Fus  Register On: 08/07/2018 15:32  Results for orders placed during the hospital encounter of 01/31/10  US VenoUS Imaging Bilateral  Narrative Clinical Data: Left leg swelling  BILATERAL LOWER EXTREMITY VENOUS DUPLEX ULTRASOUND  Technique:  Gray-scale sonography with graded compression, as well as color Doppler and duplex ultrasound, were performed to evaluate the deep venous system of both lower extremities from the level of the common femoral vein through the popliteal and proximal calf veins.  Spectral Doppler was utilized to evaluate flow at rest and with distal augmentation maneuvers.  Comparison:  None.  Findings:  Normal compressibility of  bilateral common femoral, superficial femoral, and popliteal veins is demonstrated, as well as the visualized proximal calf veins.  No filling defects to suggest DVT on grayscale or color Doppler imaging.  Doppler waveforms show normal direction of venous flow, normal respiratory phasicity and response to augmentation.  IMPRESSION: No evidence of bilateral lower extremity deep vein thrombosis.  Provider: Dondra Prader  No results found for this or any previous visit.  No results found for this or any previous visit.  Results for orders placed during the hospital encounter of 11/19/15  US Renal  Narrative CLINICAL DATA:  Follow-up of kidney stones  EXAM: RENAL / URINARY TRACT ULTRASOUND COMPLETE  COMPARISON:  Renal ultrasound of September 10, 2015 as well as KUB of the same day.  FINDINGS: Right Kidney:  Length: 11.2 cm. The renal cortical echotexture remains lower than that of the adjacent liver. There are 2 echogenic shadowing stones in the lower pole. The largest measures 1.6 cm. The smaller stone measures approximately 0.5 cm. There is no hydronephrosis.  Left Kidney:  Length: 12.1 cm. The renal cortical echotexture is  normal. No echogenic shadowing stones are observed. There is no hydronephrosis.  Bladder:  Appears normal for degree of bladder distention.  IMPRESSION: At least 2 nonobstructing stones are present in the lower pole of the right kidney. There is no hydronephrosis. No stones are observed on the left.   Electronically Signed By: David  [place] M.D. On: 11/19/2015 15:00  No valid procedures specified. No results found for this or any previous visit.  Results for orders placed during the hospital encounter of 08/16/22  CT Renal Stone Study  Narrative CLINICAL DATA:  Abdominal and flank pain on the left. Stone disease suspected.  EXAM: CT ABDOMEN AND PELVIS WITHOUT CONTRAST  TECHNIQUE: Multidetector CT imaging of the abdomen and pelvis was  performed following the standard protocol without IV contrast.  RADIATION DOSE REDUCTION: This exam was performed according to the departmental dose-optimization program which includes automated exposure control, adjustment of the mA and/or kV according to patient size and/or use of iterative reconstruction technique.  COMPARISON:  Ultrasound 11/19/2015.  CT 06/19/2010.  FINDINGS: Lower chest: Lung bases are clear.  No pleural or pericardial fluid.  Hepatobiliary: Liver parenchyma is normal without contrast. No calcified gallstones.  Pancreas: Normal  Spleen: Normal  Adrenals/Urinary Tract: Adrenal glands are normal. Right kidney contain several small nonobstructing stones, the largest in the lower pole measuring 5 mm. On the left, there is hydroureteronephrosis and renal swelling. There is several nonobstructing stones in the left kidney, the largest in the lower pole measuring 6 x 7 mm. The ureter is dilated to the level of the lower pole of the kidney, where there is an obstructing stone measuring up to 12 mm in size. No stone seen distal to that. No stone in the bladder.  Stomach/Bowel: Stomach and small intestine are normal. No abnormal colon finding.  Vascular/Lymphatic: Aorta and IVC are normal.  No adenopathy.  Reproductive: No pelvic mass.  Bilateral fallopian tube wires.  Other: No free fluid or air.  Musculoskeletal: Ordinary lower lumbar degenerative changes. Old superior endplate Schmorl's node at L1.  IMPRESSION: 1. Hydroureteronephrosis on the left secondary to a 12 mm stone in the proximal ureter at the level of the lower pole of the left kidney. 2. Several nonobstructing stones in both kidneys, the largest in the lower pole of the left kidney measuring 6 x 7 mm.   Electronically Signed By: Paulina Fusi M.D. On: 08/16/2022 16:38   Assessment & Plan:    1. Kidney stones -We discussed the management of kidney stones. These options include  observation, ureteroscopy, shockwave lithotripsy (ESWL) and percutaneous nephrolithotomy (PCNL). We discussed which options are relevant to the patient's stone(s). We discussed the natural history of kidney stones as well as the complications of untreated stones and the impact on quality of life without treatment as well as with each of the above listed treatments. We also discussed the efficacy of each treatment in its ability to clear the stone burden. With any of these management options I discussed the signs and symptoms of infection and the need for emergent treatment should these be experienced. For each option we discussed the ability of each procedure to clear the patient of their stone burden.   For observation I described the risks which include but are not limited to silent renal damage, life-threatening infection, need for emergent surgery, failure to pass stone and pain.   For ureteroscopy I described the risks which include bleeding, infection, damage to contiguous structures, positioning injury, ureteral stricture, ureteral avulsion, ureteral  injury, need for prolonged ureteral stent, inability to perform ureteroscopy, need for an interval procedure, inability to clear stone burden, stent discomfort/pain, heart attack, stroke, pulmonary embolus and the inherent risks with general anesthesia.   For shockwave lithotripsy I described the risks which include arrhythmia, kidney contusion, kidney hemorrhage, need for transfusion, pain, inability to adequately break up stone, inability to pass stone fragments, Steinstrasse, infection associated with obstructing stones, need for alternate surgical procedure, need for repeat shockwave lithotripsy, MI, CVA, PE and the inherent risks with anesthesia/conscious sedation.   For PCNL I described the risks including positioning injury, pneumothorax, hydrothorax, need for chest tube, inability to clear stone burden, renal laceration, arterial venous fistula or  malformation, need for embolization of kidney, loss of kidney or renal function, need for repeat procedure, need for prolonged nephrostomy tube, ureteral avulsion, MI, CVA, PE and the inherent risks of general anesthesia.   - The patient would like to proceed with left ESWL - Urinalysis, Routine w reflex microscopic   No follow-ups on file.  Wilkie Aye, MD  [facility] [place]

## 2022-08-20 ENCOUNTER — Encounter: Payer: Self-pay | Admitting: Urology

## 2022-08-20 NOTE — Patient Instructions (Signed)

## 2022-08-21 ENCOUNTER — Encounter (HOSPITAL_COMMUNITY): Payer: Self-pay

## 2022-08-21 ENCOUNTER — Other Ambulatory Visit: Payer: Self-pay

## 2022-08-21 ENCOUNTER — Encounter (HOSPITAL_COMMUNITY)
Admission: RE | Admit: 2022-08-21 | Discharge: 2022-08-21 | Disposition: A | Discharge status: Home / Self Care | Payer: BC Managed Care – PPO | Source: Ambulatory Visit | Attending: Urology | Admitting: Urology

## 2022-08-22 ENCOUNTER — Encounter (HOSPITAL_COMMUNITY): Payer: Self-pay | Admitting: Urology

## 2022-08-22 ENCOUNTER — Other Ambulatory Visit: Payer: Self-pay

## 2022-08-22 ENCOUNTER — Ambulatory Visit (HOSPITAL_COMMUNITY)
Admission: RE | Admit: 2022-08-22 | Discharge: 2022-08-22 | Disposition: A | Discharge status: Home / Self Care | Payer: BC Managed Care – PPO | Attending: Urology | Admitting: Urology

## 2022-08-22 ENCOUNTER — Encounter (HOSPITAL_COMMUNITY)
Admission: RE | Disposition: A | Discharge status: Home / Self Care | Payer: Self-pay | Source: Home / Self Care | Attending: Urology

## 2022-08-22 ENCOUNTER — Ambulatory Visit (HOSPITAL_COMMUNITY): Payer: BC Managed Care – PPO

## 2022-08-22 DIAGNOSIS — N200 Calculus of kidney: Secondary | ICD-10-CM | POA: Diagnosis not present

## 2022-08-22 HISTORY — DX: Other symptoms and signs involving the musculoskeletal system: R29.898

## 2022-08-22 HISTORY — PX: EXTRACORPOREAL SHOCK WAVE LITHOTRIPSY: SHX1557

## 2022-08-22 SURGERY — LITHOTRIPSY, ESWL
Anesthesia: LOCAL | Laterality: Left

## 2022-08-22 MED ORDER — DIAZEPAM 5 MG PO TABS
ORAL_TABLET | ORAL | Status: AC
  Filled 2022-08-22: qty 2

## 2022-08-22 MED ORDER — SODIUM CHLORIDE 0.9 % IV SOLN: Freq: Once | INTRAVENOUS | Status: DC

## 2022-08-22 MED ORDER — DIPHENHYDRAMINE HCL 25 MG PO CAPS
ORAL_CAPSULE | ORAL | Status: AC
  Filled 2022-08-22: qty 1

## 2022-08-22 MED ORDER — DIPHENHYDRAMINE HCL 25 MG PO CAPS
25.0000 mg | ORAL_CAPSULE | ORAL | Status: AC
  Administered 2022-08-22: 25 mg via ORAL

## 2022-08-22 MED ORDER — OXYCODONE-ACETAMINOPHEN 5-325 MG PO TABS
1.0000 | ORAL_TABLET | ORAL | 0 refills | Status: DC | PRN
Start: 2022-08-22 — End: 2022-09-18

## 2022-08-22 MED ORDER — DIAZEPAM 5 MG PO TABS
10.0000 mg | ORAL_TABLET | Freq: Once | ORAL | Status: AC
  Administered 2022-08-22: 10 mg via ORAL

## 2022-08-22 MED ORDER — ONDANSETRON HCL 4 MG PO TABS: 4.0000 mg | ORAL_TABLET | Freq: Three times a day (TID) | ORAL | 0 refills | Status: DC | PRN

## 2022-08-22 MED ORDER — TAMSULOSIN HCL 0.4 MG PO CAPS
0.4000 mg | ORAL_CAPSULE | Freq: Every day | ORAL | 0 refills | Status: DC
Start: 2022-08-22 — End: 2023-03-22

## 2022-08-22 NOTE — Interval H&P Note (Signed)
History and Physical Interval Note:  08/22/2022 8:49 AM  Tracy Serrano  has presented today for surgery, with the diagnosis of Left ureteral calculus.  The various methods of treatment have been discussed with the patient and family. After consideration of risks, benefits and other options for treatment, the patient has consented to  Procedure(s): EXTRACORPOREAL SHOCK WAVE LITHOTRIPSY (ESWL) (Left) as a surgical intervention.  The patient's history has been reviewed, patient examined, no change in status, stable for surgery.  I have reviewed the patient's chart and labs.  Questions were answered to the patient's satisfaction.     Wilkie Aye

## 2022-08-22 NOTE — Progress Notes (Signed)
Patient able to void  200 mls prior to discharge.

## 2022-08-25 ENCOUNTER — Encounter (HOSPITAL_COMMUNITY): Payer: Self-pay | Admitting: Urology

## 2022-08-28 ENCOUNTER — Other Ambulatory Visit (HOSPITAL_COMMUNITY): Payer: Self-pay | Admitting: Obstetrics and Gynecology

## 2022-08-28 DIAGNOSIS — Z1231 Encounter for screening mammogram for malignant neoplasm of breast: Secondary | ICD-10-CM

## 2022-09-08 ENCOUNTER — Ambulatory Visit (HOSPITAL_COMMUNITY)
Admission: RE | Admit: 2022-09-08 | Discharge: 2022-09-08 | Disposition: A | Discharge status: Home / Self Care | Payer: BC Managed Care – PPO | Source: Ambulatory Visit | Attending: Obstetrics and Gynecology | Admitting: Obstetrics and Gynecology

## 2022-09-08 ENCOUNTER — Encounter (HOSPITAL_COMMUNITY): Payer: Self-pay

## 2022-09-08 DIAGNOSIS — Z1231 Encounter for screening mammogram for malignant neoplasm of breast: Secondary | ICD-10-CM | POA: Diagnosis not present

## 2022-09-12 ENCOUNTER — Encounter: Payer: BC Managed Care – PPO | Admitting: Urology

## 2022-09-12 ENCOUNTER — Ambulatory Visit: Payer: BC Managed Care – PPO | Admitting: Urology

## 2022-09-18 ENCOUNTER — Ambulatory Visit (HOSPITAL_COMMUNITY)
Admission: RE | Admit: 2022-09-18 | Discharge: 2022-09-18 | Disposition: A | Discharge status: Home / Self Care | Payer: BC Managed Care – PPO | Source: Ambulatory Visit | Attending: Urology | Admitting: Urology

## 2022-09-18 ENCOUNTER — Encounter: Payer: Self-pay | Admitting: Urology

## 2022-09-18 ENCOUNTER — Ambulatory Visit: Payer: BC Managed Care – PPO | Admitting: Urology

## 2022-09-18 ENCOUNTER — Ambulatory Visit (INDEPENDENT_AMBULATORY_CARE_PROVIDER_SITE_OTHER): Payer: BC Managed Care – PPO | Admitting: Urology

## 2022-09-18 VITALS — BP 114/81 | HR 93 | Temp 98.9°F

## 2022-09-18 DIAGNOSIS — Z87442 Personal history of urinary calculi: Secondary | ICD-10-CM

## 2022-09-18 DIAGNOSIS — N200 Calculus of kidney: Secondary | ICD-10-CM | POA: Insufficient documentation

## 2022-09-18 DIAGNOSIS — Z09 Encounter for follow-up examination after completed treatment for conditions other than malignant neoplasm: Secondary | ICD-10-CM

## 2022-09-18 LAB — URINALYSIS, ROUTINE W REFLEX MICROSCOPIC
Bilirubin, UA: NEGATIVE
Glucose, UA: NEGATIVE
Ketones, UA: NEGATIVE
Nitrite, UA: NEGATIVE
Protein,UA: NEGATIVE
Specific Gravity, UA: <1.005 — ABNORMAL LOW (ref 1.005–1.030)
Urobilinogen, Ur: 0.2 mg/dL (ref 0.2–1.0)
pH, UA: 6.5 (ref 5.0–7.5)

## 2022-09-18 LAB — MICROSCOPIC EXAMINATION
Bacteria, UA: NONE SEEN
RBC, Urine: >30 /hpf — AB (ref 0–2)

## 2022-09-18 NOTE — Progress Notes (Signed)
Name: Tracy Serrano DOB: 01/12/1966 MRN: [id number]  Diagnoses: 1) Post-operative state  HPI: Tracy Serrano presents post-operatively s/p left ESWL procedure on 08/22/2022 by Dr. Ronne Binning for management of a left proximal ureteral stone.  Postop course: KUB today: Awaiting radiology read; left ureteral stone appears to no longer be visible. Bilateral intrarenal stones appreciated.   Today: She reports feeling well. She passed stone fragments and brought those for analysis. She denies increased urinary urgency, frequency, nocturia, dysuria, gross hematuria, hesitancy, straining to void, or sensations of incomplete emptying. She denies fevers, flank pain, or abdominal pain.   Fall Screening: Do you usually have a device to assist in your mobility? No   Medications: Current Outpatient Medications  Medication Sig Dispense Refill   cetirizine (ZYRTEC) 10 MG tablet Take 10 mg by mouth daily.     levocetirizine (XYZAL ALLERGY 24HR) 5 MG tablet Take 5 mg by mouth every evening.     Multiple Vitamin (MULTIVITAMIN) tablet Take 1 tablet by mouth daily.     ondansetron (ZOFRAN) 4 MG tablet Take 1 tablet (4 mg total) by mouth every 8 (eight) hours as needed for nausea or vomiting. 30 tablet 0   oxyCODONE-acetaminophen (PERCOCET/ROXICET) 5-325 MG tablet Take 1 tablet by mouth every 6 (six) hours as needed for severe pain. 30 tablet 0   tamsulosin (FLOMAX) 0.4 MG CAPS capsule Take 1 capsule (0.4 mg total) by mouth daily after supper. 30 capsule 0   No current facility-administered medications for this visit.    Allergies: Allergies  Allergen Reactions   Other Cough, Itching, Other (See Comments), Hives, Nausea Only, Rash and Swelling    Other Reaction(s): facial swelling, respiratory distress   Tree Extract Hives, Itching, Other (See Comments), Rash and Swelling    Past Medical History:  Diagnosis Date   History of kidney stones    Renal disorder    kideny stones   Weakness of  both upper extremities    Past Surgical History:  Procedure Laterality Date   basket recovery     EXTRACORPOREAL SHOCK WAVE LITHOTRIPSY Right 12/21/2016   Procedure: RIGHT EXTRACORPOREAL SHOCK WAVE LITHOTRIPSY (ESWL);  Surgeon: Malen Gauze, MD;  Location: [facility];  Service: Urology;  Laterality: Right;   EXTRACORPOREAL SHOCK WAVE LITHOTRIPSY Left 08/22/2022   Procedure: EXTRACORPOREAL SHOCK WAVE LITHOTRIPSY (ESWL);  Surgeon: Malen Gauze, MD;  Location: [facility];  Service: Urology;  Laterality: Left;   OTHER SURGICAL HISTORY     essure   Family History  Problem Relation Age of Onset   ALS Father    Kidney cancer Father    Kidney Stones Sister    Stroke Maternal Grandmother    Diabetes Maternal Grandfather    Social History   Socioeconomic History   Marital status: Single    Spouse name: Not on file   Number of children: Not on file   Years of education: Not on file   Highest education level: Not on file  Occupational History   Not on file  Tobacco Use   Smoking status: Never   Smokeless tobacco: Never  Vaping Use   Vaping status: Never Used  Substance and Sexual Activity   Alcohol use: Not Currently   Drug use: No   Sexual activity: Yes  Other Topics Concern   Not on file  Social History Narrative   Not on file   Social Determinants of Health   Financial Resource Strain: Not on file  Food Insecurity: Not on file  Transportation Needs: Not on file  Physical Activity: Not on file  Stress: Not on file  Social Connections: Not on file  Intimate Partner Violence: Not on file    SUBJECTIVE  Review of Systems Constitutional: Patient denies any unintentional weight loss or change in strength lntegumentary: Patient denies any rashes or pruritus Cardiovascular: Patient denies chest pain or syncope Respiratory: Patient denies shortness of breath Gastrointestinal: Patient denies nausea, vomiting, constipation, or diarrhea Musculoskeletal: Patient denies  muscle cramps or weakness Neurologic: Patient denies convulsions or seizures Psychiatric: Patient denies memory problems Allergic/Immunologic: Patient denies recent allergic reaction(s) Hematologic/Lymphatic: Patient denies bleeding tendencies Endocrine: Patient denies heat/cold intolerance  GU: As per HPI.  OBJECTIVE Vitals:   09/18/22 1123  BP: 114/81  Pulse: 93  Temp: 98.9 F (37.2 C)   There is no height or weight on file to calculate BMI.  Physical Examination  Constitutional: No obvious distress; patient is non-toxic appearing  Cardiovascular: No visible lower extremity edema.  Respiratory: The patient does not have audible wheezing/stridor; respirations do not appear labored  Gastrointestinal: Abdomen non-distended Musculoskeletal: Normal ROM of UEs  Skin: No obvious rashes/open sores  Neurologic: CN 2-12 grossly intact Psychiatric: Answered questions appropriately with normal affect  Hematologic/Lymphatic/Immunologic: No obvious bruises or sites of spontaneous bleeding  UA: positive for >30 RBC/hpf; no evidence of UTI  ASSESSMENT Kidney stones - Plan: Urinalysis, Routine w reflex microscopic, Calculi, with Photograph (to Clinical Lab), DG Abd 1 View  Postop check - Plan: Urinalysis, Routine w reflex microscopic, Calculi, with Photograph (to Clinical Lab)  Kidney stone - Plan: Urinalysis, Routine w reflex microscopic, Calculi, with Photograph (to Clinical Lab)  We reviewed the operative procedures and findings.  She is doing well. Pre-operative symptoms are improved since the procedure. We reviewed recent imaging results; awaiting radiology results, appears to have no acute findings.  For stone prevention: Advised adequate hydration and we discussed option to consider low oxalate diet given that calcium oxalate is the most common type of stone. Handout provided about stone prevention diet.  Will plan to follow up in 6 months with KUB for stone surveillance or sooner  if needed. Pt verbalized understanding and agreement. All questions were answered.  PLAN Advised the following: Maintain adequate fluid intake. Low oxalate diet. Return in about 6 months (around 03/21/2023) for KUB, UA, & f/u with Evette Georges NP.  Orders Placed This Encounter  Procedures   DG Abd 1 View    Standing Status:   Future    Standing Expiration Date:   09/18/2023    Order Specific Question:   Reason for Exam (SYMPTOM  OR DIAGNOSIS REQUIRED)    Answer:   kidney stone    Order Specific Question:   Is patient pregnant?    Answer:   No    Order Specific Question:   Preferred imaging location?    Answer:   [facility]   Urinalysis, Routine w reflex microscopic   Calculi, with Photograph (to Clinical Lab)    It has been explained that the patient is to follow regularly with their PCP in addition to all other providers involved in their care and to follow instructions provided by these respective offices. Patient advised to contact urology clinic if any urologic-pertaining questions, concerns, new symptoms or problems arise in the interim period.  Patient Instructions  >80% of stones are calcium oxalate. This type of stones forms when body either isn't clearing oxalate well enough, is making too much oxalate, or too little citrate.  This results in oxalate binding to form crystals, which continue to aggregate and form stones.  Limiting calcium does not help, but limiting oxalate in the diet can help. Increasing citric acid intake may also help.  The following measures may help to prevent the recurrence of stones: Increase water intake to 2-2.5 liters per day May add citrus juice (lemon, lime or orange juice) to water Moderation in dairy foods Decrease in salt content Low Oxalate diet: Oxylates are found in foods like Tomato, Spinach, red wine and chocolate (see additional resources below).  Internet resources for information regarding low oxalate  diet:  https://kidneystones.yangchunwu.com https://my.VerticalStretch.be  Foods Low in Sodium or Oxalate Foods You Can Eat  Drinks Coffee, fruit and veggie juice (using the recommended veggies), fruit punch  Fruits Apples, apricots (fresh or canned), avocado, bananas, cherries (sweet), cranberries, grapefruit, red or green grapes, lemon and lime juice, melons, nectarines, papayas, peaches, pears, pineapples, oranges, strawberries (fresh), tangerines  Veggies Artichokes, asparagus, bamboo shoots, broccoli, brussels sprouts, cabbage, cauliflower, chayote squash, chicory, corn, cucumbers, endive, lettuce, lima beans, mushrooms, onions, peas, peppers, potatoes, radishes, rutabagas, zucchini  Breads, Cereals, Grains Egg noodles, rye bread, cooked and dry cereals without nuts or bran, crackers with unsalted tops, white or wild rice  Meat, Meat Replacements, Fish, Recruitment consultant, fish, poultry, eggs, egg whites, egg replacements  Soup Homemade soup (using the recommended veggies and meat), low-sodium bouillon, low-sodium canned  Desserts Cookies, cakes, ice cream, pudding without chocolate or nuts, candy without chocolate or nuts  Fats and Oils Butter, margarine, cream, oil, salad dressing, mayo  Other Foods Unsalted potato chips or pretzels, herbs (like garlic, garlic powder, onion powder), lemon juice, salt-free seasoning blends, vinegar  Other Foods Low in Oxalate Foods You Can Eat  Drinks Beer, cola, wine, buttermilk, lemonade or limeade (without added vitamin C), milk  Meat, Meat Replacements, Fish, [employer] meat, ham, bacon, hot dogs, bratwurst, sausage, chicken nuggets, cheddar cheese, canned fish and shellfish  Soup Tomato soup, cheese soup  Other Foods Coconuts, lemon or lime juices, sugar or sweeteners, jellies or jams (from the recommended list)   Moderate-Oxalate Foods Foods to Limit   Drinks  Fruit and veggie juices (from the list below), chocolate milk, rice milk, hot cocoa, tea   Fruits Blackberries, blueberries, black currants, cherries (sour), fruit cocktail, mangoes, orange peel, prunes, purple plums   Veggies Baked beans, carrots, celery, green beans, parsnips, summer squash, tomatoes, turnips   Breads, Cereals, Grains White bread, cornbread or cornmeal, white English muffins, saltine or soda crackers, brown rice, vanilla wafers, spaghetti and other noodles, firm tofu, bagels, oatmeal   Meat/meat replacements, fish, poultry Sardines   Desserts Chocolate cake   Fats and Oils Macadamia nuts, pistachio nuts, English walnuts   Other Foods Jams or jellies (made with the fruits above), pepper   High-Oxalate Foods Foods to Avoid Drinks Chocolate drink mixes, soy milk, Ovaltine, instant iced tea, fruit juices of fruits listed below Fruits Apricots (dried), red currants, figs, kiwi, plums, rhubarb Veggies Beans (wax, dried), beets and beet greens, chives, collard greens, eggplant, escarole, dark greens of all kinds, leeks, okra, parsley, rutabagas, spinach, Swiss chard, tomato paste, watercress Breads, Cereals, Grains Amaranth, barley, white corn flour, fried potatoes, fruitcake, grits, soybean products, sweet potatoes, wheat germ and bran, buckwheat flour, All Bran cereal, graham crackers, pretzels, whole wheat bread Meat/meat replacements, fish, poultry Dried beans, peanut butter, soy burgers, miso Desserts Carob, chocolate, marmalades Fats and Oils Nuts (peanuts, almonds, pecans, cashews, hazelnuts), nut butters, sesame  seeds, tahini paste Other Foods Poppy seeds   Electronically signed by:  Donnita Falls, MSN, FNP-C, CUNP 09/18/2022 12:33 PM

## 2022-09-18 NOTE — Patient Instructions (Signed)

## 2022-09-20 ENCOUNTER — Ambulatory Visit: Payer: BC Managed Care – PPO | Admitting: Neurology

## 2022-09-20 ENCOUNTER — Telehealth: Payer: Self-pay | Admitting: Neurology

## 2022-09-20 ENCOUNTER — Encounter: Payer: BC Managed Care – PPO | Admitting: Neurology

## 2022-09-20 ENCOUNTER — Ambulatory Visit (INDEPENDENT_AMBULATORY_CARE_PROVIDER_SITE_OTHER): Payer: BC Managed Care – PPO | Admitting: Neurology

## 2022-09-20 DIAGNOSIS — G1220 Motor neuron disease, unspecified: Secondary | ICD-10-CM | POA: Diagnosis not present

## 2022-09-20 DIAGNOSIS — R29898 Other symptoms and signs involving the musculoskeletal system: Secondary | ICD-10-CM

## 2022-09-20 DIAGNOSIS — R531 Weakness: Secondary | ICD-10-CM

## 2022-09-20 NOTE — Procedures (Signed)
Full Name: Tracy Serrano Gender: Female MRN #: [id number] Date of Birth: 1965/06/18    Visit Date: 09/20/2022 14:40 Age: 57 Years Examining Physician: Dr. Levert Feinstein Referring Physician: Dr. Levert Feinstein Height: 5 feet 7 inch History; 57 year old female, presenting with painless upper extremity muscle weakness, father died of ALS at age 20.  Summary of the test: Nerve conduction study: Right sural, superficial peroneal sensory responses were normal.  Bilateral ulnar sensory responses were normal.  Bilateral median sensory responses showed mildly prolonged peak latency, right worse than left.  Right peroneal to EDB, tibial motor responses were normal.  Bilateral ulnar and median motor responses were normal.  Electromyography: Selected needle examination were performed at bilateral upper, right lower extremity muscles, right thoracic paraspinal, right sternocleidomastoid; bilateral cervical, and right lumbosacral paraspinal muscles.  There is evidence of chronic neuropathic changes involving bilateral cervical, right lumbosacral myotomes.  There is also subtle chronic neuropathic changes involving right sternocleidomastoid muscle.  There is evidence of complex enlarged motor unit potential noted at right mid thoracic paraspinal muscles.   Conclusion: This is an abnormal study.  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  In addition, there is evidence of mild bilateral carpal tunnel syndromes.    ------------------------------- Levert Feinstein, M.D.Ph.D.   [facility] [address] [place], [place] [address] Tel: [phone] Fax: [phone]  Verbal informed consent was obtained from the patient, patient was informed of potential risk of procedure, including bruising, bleeding, hematoma formation, infection, muscle weakness, muscle pain, numbness, among others.        MNC    Nerve / Sites Muscle Latency Ref. Amplitude Ref. Rel Amp Segments Distance Velocity Ref. Area    ms ms mV mV %  cm m/s m/s mVms  R Median - APB     Wrist APB 4.3 ?4.4 6.4 ?4.0 100 Wrist - APB 7   23.0     Upper arm APB 9.3  6.1  96.1 Upper arm - Wrist 26 53 ?49 22.8  L Median - APB     Wrist APB 3.9 ?4.4 8.0 ?4.0 100 Wrist - APB 7   29.0     Upper arm APB 8.5  5.8  71.7 Upper arm - Wrist 25 54 ?49 20.0  R Ulnar - ADM     Wrist ADM 2.6 ?3.3 8.6 ?6.0 100 Wrist - ADM 7   26.5     B.Elbow ADM 4.9  8.8  101 B.Elbow - Wrist 14 60 ?49 26.0     A.Elbow ADM 7.9  7.8  88.7 A.Elbow - B.Elbow 18 60 ?49 25.0  L Ulnar - ADM     Wrist ADM 2.8 ?3.3 8.2 ?6.0 100 Wrist - ADM 7   22.6     B.Elbow ADM 5.0  7.5  91.3 B.Elbow - Wrist 13 59 ?49 20.4     A.Elbow ADM 7.8  7.6  102 A.Elbow - B.Elbow 16 56 ?49 21.7  R Peroneal - EDB     Ankle EDB 4.1 ?6.5 2.6 ?2.0 100 Ankle - EDB 9   7.6     Fib head EDB 10.4  2.9  109 Fib head - Ankle 28 45 ?44 9.1  Pop fossa EDB 11.9  2.8  98 Pop fossa - Fib head 10 65 ?44 8.8         Pop fossa - Ankle      R Tibial - AH     Ankle AH 5.3 ?5.8 6.7 ?4.0 100 Ankle - AH 9   20.3     Pop fossa AH 14.3  3.2  47.9 Pop fossa - Ankle 39 44 ?41 13.5                 SNC    Nerve / Sites Rec. Site Peak Lat Ref.  Amp Ref. Segments Distance Peak Diff Ref.    ms ms V V  cm ms ms  R Sural - Ankle (Calf)     Calf Ankle 2.9 ?4.4 8 ?6 Calf - Ankle 14    R Superficial peroneal - Ankle     Lat leg Ankle 3.8 ?4.4 5 ?6 Lat leg - Ankle 14    R Median, Ulnar - Transcarpal comparison     Median Palm Wrist 2.9 ?2.2 60 ?35 Median Palm - Wrist 8       Ulnar  Palm Wrist 1.9 ?2.2 15 ?12 Ulnar Palm - Wrist 8          Median Palm - Ulnar Palm  1.0 ?0.4  L Median, Ulnar - Transcarpal comparison     Median Palm Wrist 2.6 ?2.2 75 ?35 Median Palm - Wrist 8       Ulnar Palm Wrist 1.9 ?2.2 22 ?12 Ulnar Palm - Wrist 8          Median Palm - Ulnar Palm  0.7 ?0.4  R Median - Orthodromic (Dig II, Mid palm)     Dig II Wrist 4.0 ?3.4 13 ?10 Dig II - Wrist 13    L Median - Orthodromic (Dig II, Mid palm)     Dig II Wrist 3.7 ?3.4 18 ?10 Dig II - Wrist 13    R Ulnar - Orthodromic, (Dig V, Mid palm)     Dig V Wrist 2.8 ?3.1 10 ?5 Dig V - Wrist 11    L Ulnar - Orthodromic, (Dig V, Mid palm)     Dig V Wrist 3.0 ?3.1 7 ?5 Dig V - Wrist 72                       F  Wave    Nerve F Lat Ref.   ms ms  R Ulnar - ADM 28.1 ?32.0  L Ulnar - ADM 26.5 ?32.0         EMG Summary Table    Spontaneous MUAP Recruitment  Muscle IA Fib PSW Fasc Other Amp Dur. Poly Pattern  R. First dorsal interosseous Normal None None None _______ Normal Normal Normal Reduced  R. Extensor digitorum communis Increased None None None _______ Increased Increased 1+ Reduced  R. Pronator teres Normal None None None _______ Normal Normal Normal Reduced  R. Biceps brachii Increased None None None _______ Increased Increased 1+ Reduced  R. Deltoid Increased None None None _______ Increased Increased 1+ Reduced  R. Triceps brachii Increased None None None _______ Increased Increased 1+ Reduced  R. Brachioradialis Normal None None None _______ Increased Increased 1+ Reduced  L. First dorsal interosseous Normal None None None _______ Normal Normal Normal Normal  L. Extensor digitorum communis Normal None None None _______ Increased Increased 1+ Reduced  L. Brachioradialis Normal None None None _______ Increased Increased 1+  Reduced  L. Biceps brachii Normal None None None _______ Increased Increased 1+ Reduced  L. Deltoid Normal None None None _______ Increased Increased 1+ Reduced  L. Triceps brachii  Normal None None None _______ Increased Increased 1+ Reduced  L. Cervical paraspinals Normal None None None _______ Normal Normal Normal Normal  R. Thoracic paraspinals (mid) Normal None None None _______ Increased Increased 1+ Reduced  R. Sternocleidomastoid Normal None None None _______ Increased Increased Normal Reduced  R. Tibialis anterior Increased None None None _______ Increased Increased 1+ Reduced  R. Tibialis posterior Increased None None None _______ Increased Increased 1+ Reduced  R. Gastrocnemius (Medial head) Normal None None None _______ Increased Increased 1+ Reduced  R. Peroneus longus Increased 1+ None None _______ Increased Increased 1+ Reduced  R. Vastus lateralis Normal None None None _______ Normal Normal Normal Reduced  R. Lumbar paraspinals (low) Normal None None None _______ Normal Normal Normal Normal  R. Lumbar paraspinals (mid) Normal None None None _______ Normal Normal Normal Normal

## 2022-09-20 NOTE — Addendum Note (Signed)
Addended by: Levert Feinstein on: 09/20/2022 05:51 PM   Modules accepted: Level of Service

## 2022-09-20 NOTE — Progress Notes (Addendum)
ASSESSMENT AND PLAN  Tracy Serrano is a 57 y.o. 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  MRI of brain, thoracic, lumbosacral,  CT chest to rule out paraneoplastic process  Referral to [facility]  DIAGNOSTIC DATA (LABS, IMAGING, TESTING) - I reviewed patient records, labs, notes, testing and imaging myself where available.   MEDICAL HISTORY:  Tracy Serrano, is a 57 year old female seen in request by her primary care physician Dr. Margo Aye, Jonny Ruiz for evaluation of muscle weakness  I reviewed and summarized the referring note. PMHX Kidney stone  She works as a second Merchant navy officer, began to notice painless muscle atrophy involving bilateral upper extremity since 2022 Gradually getting worse, seems to be more noticeable on the left arm, difficulty holding a gallon of milk, difficulty raising arm overhead, she denies significant pain, denies sensory loss, denied gait abnormality, no double vision, no bulbar weakness, no breathing difficulty,  She denies muscle fasciculations  Father died ALS at age 63  UPDATE 10-14-22: She continue complains significant weakness of bilateral upper extremity, she denies significant bulbar or lower extremity weakness, denies sensory changes  She return for electrodiagnostic study today, which showed widespread chronic neuropathic changes involving bilateral cervical, right lumbosacral myotomes, there are also  evidence of polyphasic enlarged motor unit potential noted at right thoracic paraspinal muscles, sternocleidal steroid muscles, 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,  Her father died of motor neuron disease at age 43, mother died of CJD  at age of 28  Extensive laboratory evaluation in April 2024 showed normal or negative HIV, RPR, B12, folic acid C-reactive protein TSH, ESR, acetylcholine receptor antibody, ANA CBC, CMP, mild elevation of CPK 370, vitamin D of 28,  CT renal stone August 16, 2022 hydroureteronephrosis of left side secondary to 12 mm stone in the proximal ureter at the level of the lower pole of the left kidney, several nonobstructing stones in both kidney,  Personally reviewed MRI of cervical spine, multilevel degenerative changes most noticeable at C6-7, left foraminal stenosis, no significant canal stenosis  PHYSICAL EXAM:   133/91, heart rate of 77,  PHYSICAL EXAMNIATION:  Gen: NAD, conversant, well nourised, well groomed                     Cardiovascular: Regular rate rhythm, no peripheral edema, warm, nontender. Eyes: Conjunctivae clear without exudates or hemorrhage Neck: Supple, no carotid bruits. Pulmonary: Clear to auscultation bilaterally   NEUROLOGICAL EXAM:  MENTAL STATUS: Speech/cognition: Awake, alert, oriented to history taking and casual conversation CRANIAL NERVES: CN II: Visual fields are full to confrontation. Pupils are round equal and briskly reactive to light. CN III, IV, VI: extraocular movement are normal. No ptosis. CN V: Facial sensation is intact to light touch CN VII: Face is symmetric with normal eye closure  CN VIII: Hearing is normal to causal conversation. CN IX, X: Phonation is normal. CN XI: Head turning and shoulder shrug are intact  MOTOR:  UE Shoulder Abduction Shoulder External Rotation  Elbow Flexion Brachioradialis Elbow  Extension Wrist Flexion Wrist Extension Grip  R 4 4 4 4  4+ 5 5 5   L 4 4 4- 4-  4+ 5 5 5    LE Hip Flexion Knee flexion Knee extension Ankle Dorsiflexion Eversion Ankle plantar Flexion Inversion Toe flexion/Extension  R 5 5 5 5 5 5 5  5/5  L 5 5 5 5 5 5 5  5/5     REFLEXES: Reflexes are 2  and symmetric at the biceps, triceps, knees (R2/L3), and ankles. Plantar responses are extensor bilaterally, Hoffmann present bilaterally  SENSORY: Intact to light touch, pinprick and vibratory sensation are intact in fingers and toes.  COORDINATION: There is no trunk or limb dysmetria noted.  GAIT/STANCE: Posture is normal. Gait is steady  REVIEW OF SYSTEMS:  Full 14 system review of systems performed and notable only for as above All other review of systems were negative.   ALLERGIES: Allergies  Allergen Reactions   Other Cough, Itching, Other (See Comments), Hives, Nausea Only, Rash and Swelling    Other Reaction(s): facial swelling, respiratory distress   Tree Extract Hives, Itching, Other (See Comments), Rash and Swelling    HOME MEDICATIONS: Current Outpatient Medications  Medication Sig Dispense Refill   cetirizine (ZYRTEC) 10 MG tablet Take 10 mg by mouth daily.     levocetirizine (XYZAL ALLERGY 24HR) 5 MG tablet Take 5 mg by mouth every evening.     Multiple Vitamin (MULTIVITAMIN) tablet Take 1 tablet by mouth daily.     ondansetron (ZOFRAN) 4 MG tablet Take 1 tablet (4 mg total) by mouth every 8 (eight) hours as needed for nausea or vomiting. 30 tablet 0   oxyCODONE-acetaminophen (PERCOCET/ROXICET) 5-325 MG tablet Take 1 tablet by mouth every 6 (six) hours as needed for severe pain. 30 tablet 0   tamsulosin (FLOMAX) 0.4 MG CAPS capsule Take 1 capsule (0.4 mg total) by mouth daily after supper. 30 capsule 0   No current facility-administered medications for this visit.    PAST MEDICAL HISTORY: Past Medical History:  Diagnosis Date   History of kidney stones    Renal disorder    kideny stones   Weakness of both upper extremities     PAST SURGICAL  HISTORY: Past Surgical History:  Procedure Laterality Date   basket recovery     EXTRACORPOREAL SHOCK WAVE LITHOTRIPSY Right 12/21/2016   Procedure: RIGHT EXTRACORPOREAL SHOCK WAVE LITHOTRIPSY (ESWL);  Surgeon: Malen Gauze, MD;  Location: [facility];  Service: Urology;  Laterality: Right;   EXTRACORPOREAL SHOCK WAVE LITHOTRIPSY Left 08/22/2022   Procedure: EXTRACORPOREAL SHOCK WAVE LITHOTRIPSY (ESWL);  Surgeon: Malen Gauze, MD;  Location: [facility];  Service: Urology;  Laterality: Left;   OTHER SURGICAL HISTORY     essure    FAMILY HISTORY: Family History  Problem Relation Age of Onset   ALS Father    Kidney cancer Father    Kidney Stones Sister    Stroke Maternal Grandmother    Diabetes Maternal Grandfather     SOCIAL HISTORY: Social History   Socioeconomic History   Marital status: Single    Spouse name: Not on file   Number of children: Not on file   Years of education: Not on file   Highest education level: Not on file  Occupational History   Not on file  Tobacco Use   Smoking status: Never   Smokeless tobacco: Never  Vaping Use   Vaping status: Never Used  Substance and Sexual Activity   Alcohol  use: Not Currently   Drug use: No   Sexual activity: Yes  Other Topics Concern   Not on file  Social History Narrative   Not on file   Social Determinants of Health   Financial Resource Strain: Not on file  Food Insecurity: Not on file  Transportation Needs: Not on file  Physical Activity: Not on file  Stress: Not on file  Social Connections: Not on file  Intimate Partner Violence: Not on file      Levert Feinstein, M.D. Ph.D.  [facility] [address], [address] [place], [place] [address] Ph: [phone] Fax: [phone]  CC:  Benita Stabile, MD [address] [place],  [place] [address]  Benita Stabile, MD

## 2022-09-20 NOTE — Telephone Encounter (Signed)
Patient like MRIs and CT done at [facility]

## 2022-09-25 NOTE — Telephone Encounter (Signed)
MRIs: 8/5 Tracy Serrano: [id number] exp. 09/25/22-10/24/22  CT: Tracy Serrano: [id number] exp. 09/25/22-10/24/22 sent to [employer] [phone]

## 2022-10-02 NOTE — Progress Notes (Signed)
Please let pt know KUB showed no acute findings. Has bilateral non-obstructing stones.

## 2022-10-02 NOTE — Progress Notes (Signed)
Please let pt know stone analysis showed 90% calcium oxalate and 10% hydroxyapatite composition. Please advise adequate fluid intake (around 2 liters per day) and low oxalate diet for stone prevention.

## 2022-10-05 ENCOUNTER — Ambulatory Visit (HOSPITAL_COMMUNITY)
Admission: RE | Admit: 2022-10-05 | Discharge: 2022-10-05 | Disposition: A | Discharge status: Home / Self Care | Payer: BC Managed Care – PPO | Source: Ambulatory Visit | Attending: Neurology | Admitting: Neurology

## 2022-10-05 DIAGNOSIS — G1220 Motor neuron disease, unspecified: Secondary | ICD-10-CM | POA: Diagnosis present

## 2022-10-05 DIAGNOSIS — R531 Weakness: Secondary | ICD-10-CM | POA: Diagnosis present

## 2022-10-05 DIAGNOSIS — R29898 Other symptoms and signs involving the musculoskeletal system: Secondary | ICD-10-CM | POA: Insufficient documentation

## 2022-10-05 MED ORDER — IOHEXOL 300 MG/ML  SOLN
100.0000 mL | Freq: Once | INTRAMUSCULAR | Status: AC | PRN
  Administered 2022-10-05: 100 mL via INTRAVENOUS

## 2022-10-05 MED ORDER — IOHEXOL 9 MG/ML PO SOLN
ORAL | Status: AC
  Filled 2022-10-05: qty 1000

## 2022-10-05 MED ORDER — IOHEXOL 9 MG/ML PO SOLN: 500.0000 mL | ORAL | Status: AC

## 2022-10-25 ENCOUNTER — Telehealth: Payer: Self-pay | Admitting: Neurology

## 2022-10-25 NOTE — Telephone Encounter (Signed)
LVM and sent mychart msg informing pt of need to reschedule 12/26/22 appt - office closing for election day

## 2022-10-26 ENCOUNTER — Ambulatory Visit (HOSPITAL_COMMUNITY)
Admission: RE | Admit: 2022-10-26 | Discharge: 2022-10-26 | Disposition: A | Discharge status: Home / Self Care | Payer: BC Managed Care – PPO | Source: Ambulatory Visit | Attending: Neurology | Admitting: Neurology

## 2022-10-26 DIAGNOSIS — R531 Weakness: Secondary | ICD-10-CM

## 2022-10-26 DIAGNOSIS — G1220 Motor neuron disease, unspecified: Secondary | ICD-10-CM | POA: Insufficient documentation

## 2022-10-26 DIAGNOSIS — R29898 Other symptoms and signs involving the musculoskeletal system: Secondary | ICD-10-CM | POA: Diagnosis present

## 2022-12-25 ENCOUNTER — Telehealth: Payer: Self-pay | Admitting: Neurology

## 2022-12-25 NOTE — Telephone Encounter (Signed)
LVM and sent mychart msg informing pt of need to reschedule 02/20/23 appt - MD Out

## 2022-12-26 ENCOUNTER — Ambulatory Visit: Payer: BC Managed Care – PPO | Admitting: Neurology

## 2023-01-31 ENCOUNTER — Telehealth: Payer: Self-pay | Admitting: Neurology

## 2023-01-31 NOTE — Telephone Encounter (Signed)
LVM and sent mychart msg informing pt of need to reschedule 02/12/23 appt - MD out

## 2023-02-06 ENCOUNTER — Telehealth: Payer: Self-pay | Admitting: Neurology

## 2023-02-06 NOTE — Telephone Encounter (Signed)
Noted  

## 2023-02-06 NOTE — Telephone Encounter (Signed)
Pt called to inform us that she has transferred her care to [facility]

## 2023-02-12 ENCOUNTER — Ambulatory Visit: Payer: BC Managed Care – PPO | Admitting: Neurology

## 2023-02-20 ENCOUNTER — Ambulatory Visit: Payer: BC Managed Care – PPO | Admitting: Neurology

## 2023-03-13 NOTE — Progress Notes (Signed)
Name: Tracy Serrano DOB: 1965-08-15 MRN: [id number]  History of Present Illness: Tracy Serrano is a 58 y.o. female who presents today for follow up visit at [facility] [place]. - GU History: 1. Kidney stones. - 08/22/2022: Underwent left ESWL procedure by Dr. Ronne Binning.  - 09/18/2022: Stone analysis showed 90% calcium oxalate and 10% hydroxyapatite composition.   At last visit on 09/18/2022: Doing well.  Since last visit: > 10/05/2022: CT chest/abdomen/pelvis w/ contrast showed an 8 mm distal left ureteral stone with left hydroureteronephrosis and additional bilateral nonobstructive renal calculi measuring up to 5 mm.  Today: KUB today: Awaiting radiology read; 8 mm distal left ureteral stone appreciated per provider interpretation.  She denies recent stone passage. She denies flank pain or abdominal pain. She denies fevers, nausea, or vomiting.  She denies increased urinary urgency, frequency, nocturia, dysuria, gross hematuria, hesitancy, straining to void, or sensations of incomplete emptying.  She reports that she had blood work at her PCP's office last week and was informed that her renal function was normal.   Fall Screening: Do you usually have a device to assist in your mobility? No   Medications: Current Outpatient Medications  Medication Sig Dispense Refill   riluzole (RILUTEK) 50 MG tablet Take 1 tablet by mouth every 12 (twelve) hours.     cetirizine (ZYRTEC) 10 MG tablet Take 10 mg by mouth daily.     EPINEPHrine 0.3 mg/0.3 mL IJ SOAJ injection Inject into the muscle.     Multiple Vitamin (MULTIVITAMIN) tablet Take 1 tablet by mouth daily.     tamsulosin (FLOMAX) 0.4 MG CAPS capsule Take 1 capsule (0.4 mg total) by mouth daily after supper. 30 capsule 0   No current facility-administered medications for this visit.    Allergies: Allergies  Allergen Reactions   Other Cough, Itching, Other (See Comments), Hives, Nausea Only, Rash and Swelling     Other Reaction(s): facial swelling, respiratory distress   Tree Extract Hives, Itching, Other (See Comments), Rash and Swelling    Past Medical History:  Diagnosis Date   History of kidney stones    Renal disorder    kideny stones   Weakness of both upper extremities    Past Surgical History:  Procedure Laterality Date   basket recovery     EXTRACORPOREAL SHOCK WAVE LITHOTRIPSY Right 12/21/2016   Procedure: RIGHT EXTRACORPOREAL SHOCK WAVE LITHOTRIPSY (ESWL);  Surgeon: Malen Gauze, MD;  Location: [facility];  Service: Urology;  Laterality: Right;   EXTRACORPOREAL SHOCK WAVE LITHOTRIPSY Left 08/22/2022   Procedure: EXTRACORPOREAL SHOCK WAVE LITHOTRIPSY (ESWL);  Surgeon: Malen Gauze, MD;  Location: [facility];  Service: Urology;  Laterality: Left;   OTHER SURGICAL HISTORY     essure   Family History  Problem Relation Age of Onset   ALS Father    Kidney cancer Father    Kidney Stones Sister    Stroke Maternal Grandmother    Diabetes Maternal Grandfather    Social History   Socioeconomic History   Marital status: Single    Spouse name: Not on file   Number of children: Not on file   Years of education: Not on file   Highest education level: Not on file  Occupational History   Not on file  Tobacco Use   Smoking status: Never   Smokeless tobacco: Never  Vaping Use   Vaping status: Never Used  Substance and Sexual Activity   Alcohol use: Not Currently   Drug use: No  Sexual activity: Yes  Other Topics Concern   Not on file  Social History Narrative   Not on file   Social Drivers of Health   Financial Resource Strain: Not on file  Food Insecurity: Not on file  Transportation Needs: Not on file  Physical Activity: Not on file  Stress: Not on file  Social Connections: Not on file  Intimate Partner Violence: Not on file    SUBJECTIVE  Review of Systems Constitutional: Patient denies any unintentional weight loss or change in strength lntegumentary:  Patient denies any rashes or pruritus Cardiovascular: Patient denies chest pain or syncope Respiratory: Patient denies shortness of breath Gastrointestinal: Patient denies nausea, vomiting, constipation, or diarrhea Musculoskeletal: Patient denies muscle cramps or weakness Neurologic: Patient denies convulsions or seizures Allergic/Immunologic: Patient denies recent allergic reaction(s) Hematologic/Lymphatic: Patient denies bleeding tendencies Endocrine: Patient denies heat/cold intolerance  GU: As per HPI.  OBJECTIVE Vitals:   03/22/23 1546  BP: 112/74  Pulse: 78   There is no height or weight on file to calculate BMI.  Physical Examination Constitutional: No obvious distress; patient is non-toxic appearing  Cardiovascular: No visible lower extremity edema.  Respiratory: The patient does not have audible wheezing/stridor; respirations do not appear labored  Gastrointestinal: Abdomen non-distended Musculoskeletal: Normal ROM of UEs  Skin: No obvious rashes/open sores  Neurologic: CN 2-12 grossly intact Psychiatric: Answered questions appropriately with normal affect  Hematologic/Lymphatic/Immunologic: No obvious bruises or sites of spontaneous bleeding  Urine microscopy: 6-10 WBC/hpf, 0-2 RBC/hpf, few bacteria, crystals present  ASSESSMENT Left ureteral stone - Plan: Ambulatory Referral For Surgery Scheduling  Kidney stones - Plan: Urinalysis, Routine w reflex microscopic, tamsulosin (FLOMAX) 0.4 MG CAPS capsule, Ambulatory Referral For Surgery Scheduling  We reviewed recent imaging results; awaiting radiology results, appears that the 8 mm distal left ureteral stone seen on CT in August 2024 is still present. We agreed to restart Flomax (Tamsulosin) 0.4 mg daily and plan for ESWL with Dr. Ronne Binning. She would prefer to wait until 06/05/2023 during her spring break if he thinks that is safe (she is a Engineer, site); will consult with him and notify patient of his  recommendation.  She was advised to go to the ER if She develops fever >100.5 F, uncontrollable pain, or other significantly concerning symptoms.  Patient verbalized understanding of and agreement with current plan. All questions were answered.  PLAN Advised the following: Flomax (Tamsulosin) 0.4 mg daily. Return for surgery.  Orders Placed This Encounter  Procedures   Urinalysis, Routine w reflex microscopic   Ambulatory Referral For Surgery Scheduling    Referral Priority:   Routine    Referral Type:   Consultation    Referred to Provider:   Malen Gauze, MD    Number of Visits Requested:   1   It has been explained that the patient is to follow regularly with their PCP in addition to all other providers involved in their care and to follow instructions provided by these respective offices. Patient advised to contact urology clinic if any urologic-pertaining questions, concerns, new symptoms or problems arise in the interim period.  There are no Patient Instructions on file for this visit.  Electronically signed by:  Donnita Falls, MSN, FNP-C, CUNP 03/22/2023 5:08 PM

## 2023-03-22 ENCOUNTER — Ambulatory Visit (HOSPITAL_COMMUNITY)
Admission: RE | Admit: 2023-03-22 | Discharge: 2023-03-22 | Disposition: A | Discharge status: Home / Self Care | Payer: 59 | Source: Ambulatory Visit | Attending: Urology | Admitting: Urology

## 2023-03-22 ENCOUNTER — Telehealth: Payer: Self-pay

## 2023-03-22 ENCOUNTER — Ambulatory Visit: Payer: 59 | Admitting: Urology

## 2023-03-22 ENCOUNTER — Encounter: Payer: Self-pay | Admitting: Urology

## 2023-03-22 VITALS — BP 112/74 | HR 78

## 2023-03-22 DIAGNOSIS — N200 Calculus of kidney: Secondary | ICD-10-CM | POA: Insufficient documentation

## 2023-03-22 DIAGNOSIS — N201 Calculus of ureter: Secondary | ICD-10-CM | POA: Diagnosis not present

## 2023-03-22 MED ORDER — TAMSULOSIN HCL 0.4 MG PO CAPS: 0.4000 mg | ORAL_CAPSULE | Freq: Every day | ORAL | 0 refills | Status: DC

## 2023-03-22 NOTE — Telephone Encounter (Signed)
Tried calling patient with no answer. Left vm making patient aware to get KUB prior to appointment today.

## 2023-03-23 LAB — URINALYSIS, ROUTINE W REFLEX MICROSCOPIC
Bilirubin, UA: NEGATIVE
Glucose, UA: NEGATIVE
Ketones, UA: NEGATIVE
Nitrite, UA: NEGATIVE
Protein,UA: NEGATIVE
RBC, UA: NEGATIVE
Specific Gravity, UA: 1.02 (ref 1.005–1.030)
Urobilinogen, Ur: 0.2 mg/dL (ref 0.2–1.0)
pH, UA: 7 (ref 5.0–7.5)

## 2023-03-23 LAB — MICROSCOPIC EXAMINATION

## 2023-04-03 ENCOUNTER — Other Ambulatory Visit: Payer: Self-pay

## 2023-04-03 DIAGNOSIS — N200 Calculus of kidney: Secondary | ICD-10-CM

## 2023-04-25 ENCOUNTER — Other Ambulatory Visit: Payer: Self-pay

## 2023-04-25 DIAGNOSIS — Z09 Encounter for follow-up examination after completed treatment for conditions other than malignant neoplasm: Secondary | ICD-10-CM

## 2023-04-25 DIAGNOSIS — N200 Calculus of kidney: Secondary | ICD-10-CM

## 2023-05-18 ENCOUNTER — Telehealth: Payer: Self-pay

## 2023-05-18 ENCOUNTER — Other Ambulatory Visit: Payer: Self-pay

## 2023-05-18 ENCOUNTER — Other Ambulatory Visit: Payer: 59

## 2023-05-18 DIAGNOSIS — N200 Calculus of kidney: Secondary | ICD-10-CM

## 2023-05-18 LAB — MICROSCOPIC EXAMINATION
Bacteria, UA: NONE SEEN
RBC, Urine: NONE SEEN /HPF (ref 0–2)

## 2023-05-18 LAB — URINALYSIS, ROUTINE W REFLEX MICROSCOPIC
Bilirubin, UA: NEGATIVE
Glucose, UA: NEGATIVE
Ketones, UA: NEGATIVE
Nitrite, UA: NEGATIVE
Protein,UA: NEGATIVE
RBC, UA: NEGATIVE
Specific Gravity, UA: 1.015 (ref 1.005–1.030)
Urobilinogen, Ur: 0.2 mg/dL (ref 0.2–1.0)
pH, UA: 7 (ref 5.0–7.5)

## 2023-05-18 NOTE — Telephone Encounter (Signed)
 Called Pt to let her know her urine drop off was clear per verbal from MD [facility]

## 2023-06-01 ENCOUNTER — Encounter (HOSPITAL_COMMUNITY): Payer: Self-pay

## 2023-06-01 ENCOUNTER — Encounter (HOSPITAL_COMMUNITY)
Admission: RE | Admit: 2023-06-01 | Discharge: 2023-06-01 | Disposition: A | Discharge status: Home / Self Care | Payer: 59 | Source: Ambulatory Visit | Attending: Urology | Admitting: Urology

## 2023-06-01 NOTE — Pre-Procedure Instructions (Signed)
 Pre-op phone call done. Patient was not give blue litho folder. I directed her to the office for this.

## 2023-06-04 NOTE — Progress Notes (Signed)
 H&P  Chief Complaint: Rt ureteral stone  History of Present Illness: Tracy Serrano is a 58 y.o. year old female presenting for ESL of a Rt distal ureteral stone--8 mm.  Past Medical History:  Diagnosis Date   History of kidney stones    Renal disorder    kideny stones   Weakness of both upper extremities    ALS    Past Surgical History:  Procedure Laterality Date   basket recovery     CYSTOSCOPY/RETROGRADE/URETEROSCOPY/STONE EXTRACTION WITH BASKET     2011   EXTRACORPOREAL SHOCK WAVE LITHOTRIPSY Right 12/21/2016   Procedure: RIGHT EXTRACORPOREAL SHOCK WAVE LITHOTRIPSY (ESWL);  Surgeon: Marco Severs, MD;  Location: [facility];  Service: Urology;  Laterality: Right;   EXTRACORPOREAL SHOCK WAVE LITHOTRIPSY Left 08/22/2022   Procedure: EXTRACORPOREAL SHOCK WAVE LITHOTRIPSY (ESWL);  Surgeon: Marco Severs, MD;  Location: [facility];  Service: Urology;  Laterality: Left;   OTHER SURGICAL HISTORY     essure    Home Medications:  No medications prior to admission.    Allergies:  Allergies  Allergen Reactions   Other Cough, Itching, Other (See Comments), Hives, Nausea Only, Rash and Swelling    Other Reaction(s): facial swelling, respiratory distress   Tree Extract Hives, Itching, Other (See Comments), Rash and Swelling    Family History  Problem Relation Age of Onset   ALS Father    Kidney cancer Father    Kidney Stones Sister    Stroke Maternal Grandmother    Diabetes Maternal Grandfather     Social History:  reports that she has never smoked. She has never used smokeless tobacco. She reports that she does not currently use alcohol. She reports that she does not use drugs.  ROS: A complete review of systems was performed.  All systems are negative except for pertinent findings as noted.  Physical Exam:  Vital signs in last 24 hours:   General:  Alert and oriented, No acute distress HEENT: Normocephalic, atraumatic Neck: No JVD or  lymphadenopathy Cardiovascular: Regular rate  Lungs: Normal inspiratory/expiratory excursion Abdomen: Soft, nontender, nondistended, no abdominal masses Back: No CVA tenderness Extremities: No edema Neurologic: Grossly intact  I have reviewed prior pt notes  I have reviewed notes from referring/previous physicians  I have reviewed urinalysis results  I have independently reviewed prior imaging  I have reviewed prior urine culture   Impression/Assessment:  ***  Plan:  ***  Malcolm Scrivener Meleni Delahunt 06/04/2023, 10:36 AM  Malcolm Scrivener. Chibueze Beasley MD

## 2023-06-05 ENCOUNTER — Ambulatory Visit (HOSPITAL_COMMUNITY)

## 2023-06-05 ENCOUNTER — Encounter (HOSPITAL_COMMUNITY)
Admission: RE | Disposition: A | Discharge status: Home / Self Care | Payer: Self-pay | Source: Home / Self Care | Attending: Urology

## 2023-06-05 ENCOUNTER — Encounter (HOSPITAL_COMMUNITY): Payer: Self-pay | Admitting: Urology

## 2023-06-05 ENCOUNTER — Encounter: Payer: Self-pay | Admitting: Urology

## 2023-06-05 ENCOUNTER — Ambulatory Visit (HOSPITAL_COMMUNITY)
Admission: RE | Admit: 2023-06-05 | Discharge: 2023-06-05 | Disposition: A | Discharge status: Home / Self Care | Payer: 59 | Attending: Urology | Admitting: Urology

## 2023-06-05 DIAGNOSIS — Z87442 Personal history of urinary calculi: Secondary | ICD-10-CM | POA: Insufficient documentation

## 2023-06-05 DIAGNOSIS — N201 Calculus of ureter: Secondary | ICD-10-CM | POA: Diagnosis present

## 2023-06-05 DIAGNOSIS — N200 Calculus of kidney: Secondary | ICD-10-CM

## 2023-06-05 HISTORY — PX: EXTRACORPOREAL SHOCK WAVE LITHOTRIPSY: SHX1557

## 2023-06-05 SURGERY — LITHOTRIPSY, ESWL
Anesthesia: LOCAL | Laterality: Left

## 2023-06-05 MED ORDER — DIPHENHYDRAMINE HCL 25 MG PO CAPS
25.0000 mg | ORAL_CAPSULE | ORAL | Status: AC
  Administered 2023-06-05: 25 mg via ORAL
  Filled 2023-06-05: qty 1

## 2023-06-05 MED ORDER — DIAZEPAM 5 MG PO TABS
10.0000 mg | ORAL_TABLET | Freq: Once | ORAL | Status: AC
  Administered 2023-06-05: 10 mg via ORAL
  Filled 2023-06-05: qty 2

## 2023-06-05 NOTE — Discharge Instructions (Addendum)
 See [facility] discharge instructions in chart.   Post Anesthesia Home Care Instructions  Activity: Get plenty of rest for the remainder of the day. A responsible individual must stay with you for 24 hours following the procedure.  For the next 24 hours, DO NOT: -Drive a car -Advertising copywriter -Drink alcoholic beverages -Take any medication unless instructed by your physician -Make any legal decisions or sign important papers.  Meals: Start with liquid foods such as gelatin or soup. Progress to regular foods as tolerated. Avoid greasy, spicy, heavy foods. If nausea and/or vomiting occur, drink only clear liquids until the nausea and/or vomiting subsides. Call your physician if vomiting continues.  Special Instructions/Symptoms: Your throat may feel dry or sore from the anesthesia or the breathing tube placed in your throat during surgery. If this causes discomfort, gargle with warm salt water. The discomfort should disappear within 24 hours.

## 2023-06-05 NOTE — Op Note (Signed)
See [facility] OP note scanned into chart. 

## 2023-06-06 ENCOUNTER — Encounter (HOSPITAL_COMMUNITY): Payer: Self-pay | Admitting: Urology

## 2023-06-25 ENCOUNTER — Encounter: Payer: 59 | Admitting: Urology

## 2023-06-27 ENCOUNTER — Ambulatory Visit (INDEPENDENT_AMBULATORY_CARE_PROVIDER_SITE_OTHER): Admitting: Urology

## 2023-06-27 ENCOUNTER — Ambulatory Visit (HOSPITAL_COMMUNITY)
Admission: RE | Admit: 2023-06-27 | Discharge: 2023-06-27 | Disposition: A | Discharge status: Home / Self Care | Source: Ambulatory Visit | Attending: Urology | Admitting: Urology

## 2023-06-27 VITALS — BP 108/75 | HR 87

## 2023-06-27 DIAGNOSIS — Z87442 Personal history of urinary calculi: Secondary | ICD-10-CM

## 2023-06-27 DIAGNOSIS — N200 Calculus of kidney: Secondary | ICD-10-CM | POA: Insufficient documentation

## 2023-06-27 DIAGNOSIS — Z09 Encounter for follow-up examination after completed treatment for conditions other than malignant neoplasm: Secondary | ICD-10-CM

## 2023-06-27 LAB — URINALYSIS, ROUTINE W REFLEX MICROSCOPIC
Bilirubin, UA: NEGATIVE
Glucose, UA: NEGATIVE
Ketones, UA: NEGATIVE
Nitrite, UA: NEGATIVE
Protein,UA: NEGATIVE
Specific Gravity, UA: 1.01 (ref 1.005–1.030)
Urobilinogen, Ur: 0.2 mg/dL (ref 0.2–1.0)
pH, UA: 6.5 (ref 5.0–7.5)

## 2023-06-27 LAB — MICROSCOPIC EXAMINATION

## 2023-06-27 NOTE — Progress Notes (Signed)
 06/27/2023 2:42 PM   Tracy Serrano 07/22/1965 [id number]  Referring provider: Omie Bickers, MD [address] [place],  [place] [address]  nephrolithiasis   HPI: [name] is a 57yo here for followup after ESWL. She passed numerous fragments. She denies nay flank pain. NO significant LUTS   PMH: Past Medical History:  Diagnosis Date   History of kidney stones    Renal disorder    kideny stones   Weakness of both upper extremities    ALS    Surgical History: Past Surgical History:  Procedure Laterality Date   basket recovery     CYSTOSCOPY/RETROGRADE/URETEROSCOPY/STONE EXTRACTION WITH BASKET     2011   EXTRACORPOREAL SHOCK WAVE LITHOTRIPSY Right 12/21/2016   Procedure: RIGHT EXTRACORPOREAL SHOCK WAVE LITHOTRIPSY (ESWL);  Surgeon: Tracy Severs, MD;  Location: [facility];  Service: Urology;  Laterality: Right;   EXTRACORPOREAL SHOCK WAVE LITHOTRIPSY Left 08/22/2022   Procedure: EXTRACORPOREAL SHOCK WAVE LITHOTRIPSY (ESWL);  Surgeon: Tracy Severs, MD;  Location: [facility];  Service: Urology;  Laterality: Left;   EXTRACORPOREAL SHOCK WAVE LITHOTRIPSY Left 06/05/2023   Procedure: LITHOTRIPSY, ESWL;  Surgeon: Tracy Frizzle, MD;  Location: [facility];  Service: Urology;  Laterality: Left;   OTHER SURGICAL HISTORY     essure    Home Medications:  Allergies as of 06/27/2023       Reactions   Other Cough, Itching, Other (See Comments), Hives, Nausea Only, Rash, Swelling   Other Reaction(s): facial swelling, respiratory distress   Tree Extract Hives, Itching, Other (See Comments), Rash, Swelling        Medication List        Accurate as of Jun 27, 2023  2:42 PM. If you have any questions, ask your nurse or doctor.          cetirizine 10 MG tablet Commonly known as: ZYRTEC Take 10 mg by mouth daily.   EPINEPHrine  0.3 mg/0.3 mL Soaj injection Commonly known as: EPI-PEN Inject into the muscle.   multivitamin tablet Take 1 tablet by mouth daily.    riluzole 50 MG tablet Commonly known as: RILUTEK Take 1 tablet by mouth every 12 (twelve) hours.   tamsulosin  0.4 MG Caps capsule Commonly known as: FLOMAX  Take 1 capsule (0.4 mg total) by mouth daily after supper.        Allergies:  Allergies  Allergen Reactions   Other Cough, Itching, Other (See Comments), Hives, Nausea Only, Rash and Swelling    Other Reaction(s): facial swelling, respiratory distress   Tree Extract Hives, Itching, Other (See Comments), Rash and Swelling    Family History: Family History  Problem Relation Age of Onset   ALS Father    Kidney cancer Father    Kidney Stones Sister    Stroke Maternal Grandmother    Diabetes Maternal Grandfather     Social History:  reports that she has never smoked. She has never used smokeless tobacco. She reports that she does not currently use alcohol. She reports that she does not use drugs.  ROS: All other review of systems were reviewed and are negative except what is noted above in HPI  Physical Exam: BP 108/75   Pulse 87   LMP 01/01/2017   Constitutional:  Alert and oriented, No acute distress. HEENT: [place] AT, moist mucus membranes.  Trachea midline, no masses. Cardiovascular: No clubbing, cyanosis, or edema. Respiratory: Normal respiratory effort, no increased work of breathing. GI: Abdomen is soft, nontender, nondistended, no abdominal masses GU:  No CVA tenderness.  Lymph: No cervical or inguinal lymphadenopathy. Skin: No rashes, bruises or suspicious lesions. Neurologic: Grossly intact, no focal deficits, moving all 4 extremities. Psychiatric: Normal mood and affect.  Laboratory Data: Lab Results  Component Value Date   WBC 14.0 (H) 08/16/2022   HGB 12.3 08/16/2022   HCT 35.8 (L) 08/16/2022   MCV 94.0 08/16/2022   PLT 303 08/16/2022    Lab Results  Component Value Date   CREATININE 0.81 08/16/2022    No results found for: "PSA"  No results found for: "TESTOSTERONE"  No results found for:  "HGBA1C"  Urinalysis    Component Value Date/Time   COLORURINE YELLOW 08/16/2022 1447   APPEARANCEUR Clear 05/18/2023 1224   LABSPEC 1.018 08/16/2022 1447   PHURINE 8.0 08/16/2022 1447   GLUCOSEU Negative 05/18/2023 1224   HGBUR SMALL (A) 08/16/2022 1447   BILIRUBINUR Negative 05/18/2023 1224   KETONESUR 20 (A) 08/16/2022 1447   PROTEINUR Negative 05/18/2023 1224   PROTEINUR 30 (A) 08/16/2022 1447   UROBILINOGEN 0.2 03/30/2013 1911   NITRITE Negative 05/18/2023 1224   NITRITE NEGATIVE 08/16/2022 1447   LEUKOCYTESUR 1+ (A) 05/18/2023 1224   LEUKOCYTESUR TRACE (A) 08/16/2022 1447    Lab Results  Component Value Date   LABMICR See below: 05/18/2023   WBCUA 0-5 05/18/2023   LABEPIT 0-10 05/18/2023   BACTERIA None seen 05/18/2023    Pertinent Imaging: KUB today: Images reviewed and discussed with the patient  Results for orders placed during the hospital encounter of 06/05/23  DG Abd 1 View  Narrative CLINICAL DATA:  Nephrolithiasis.  EXAM: ABDOMEN - 1 VIEW  COMPARISON:  03/22/2023  FINDINGS: 8 mm calcification projects over the lower right kidney. 8 mm calcification projects over the lower left kidney. No evidence of new renal calculi. Stable distribution of pelvic phleboliths. Bilateral tubal occlusion device in the pelvis. Moderate colonic stool burden without obstruction.  IMPRESSION: Bilateral renal calculi measuring 8 mm.   Electronically Signed By: Tracy Serrano M.D. On: 06/12/2023 18:21  Results for orders placed during the hospital encounter of 01/31/10  US  VenoUS Imaging Bilateral  Narrative Clinical Data: Left leg swelling  BILATERAL LOWER EXTREMITY VENOUS DUPLEX ULTRASOUND  Technique:  Gray-scale sonography with graded compression, as well as color Doppler and duplex ultrasound, were performed to evaluate the deep venous system of both lower extremities from the level of the common femoral vein through the popliteal and proximal  calf veins.  Spectral Doppler was utilized to evaluate flow at rest and with distal augmentation maneuvers.  Comparison:  None.  Findings:  Normal compressibility of bilateral common femoral, superficial femoral, and popliteal veins is demonstrated, as well as the visualized proximal calf veins.  No filling defects to suggest DVT on grayscale or color Doppler imaging.  Doppler waveforms show normal direction of venous flow, normal respiratory phasicity and response to augmentation.  IMPRESSION: No evidence of bilateral lower extremity deep vein thrombosis.  Provider: Jan Mcgill  No results found for this or any previous visit.  No results found for this or any previous visit.  Results for orders placed during the hospital encounter of 11/19/15  US  Renal  Narrative CLINICAL DATA:  Follow-up of kidney stones  EXAM: RENAL / URINARY TRACT ULTRASOUND COMPLETE  COMPARISON:  Renal ultrasound of September 10, 2015 as well as KUB of the same day.  FINDINGS: Right Kidney:  Length: 11.2 cm. The renal cortical echotexture remains lower than that of the adjacent liver. There are 2 echogenic  shadowing stones in the lower pole. The largest measures 1.6 cm. The smaller stone measures approximately 0.5 cm. There is no hydronephrosis.  Left Kidney:  Length: 12.1 cm. The renal cortical echotexture is normal. No echogenic shadowing stones are observed. There is no hydronephrosis.  Bladder:  Appears normal for degree of bladder distention.  IMPRESSION: At least 2 nonobstructing stones are present in the lower pole of the right kidney. There is no hydronephrosis. No stones are observed on the left.   Electronically Signed By: David  [place] M.D. On: 11/19/2015 15:00  No results found for this or any previous visit.  No results found for this or any previous visit.  Results for orders placed during the hospital encounter of 08/16/22  CT Renal Stone  Study  Narrative CLINICAL DATA:  Abdominal and flank pain on the left. Stone disease suspected.  EXAM: CT ABDOMEN AND PELVIS WITHOUT CONTRAST  TECHNIQUE: Multidetector CT imaging of the abdomen and pelvis was performed following the standard protocol without IV contrast.  RADIATION DOSE REDUCTION: This exam was performed according to the departmental dose-optimization program which includes automated exposure control, adjustment of the mA and/or kV according to patient size and/or use of iterative reconstruction technique.  COMPARISON:  Ultrasound 11/19/2015.  CT 06/19/2010.  FINDINGS: Lower chest: Lung bases are clear.  No pleural or pericardial fluid.  Hepatobiliary: Liver parenchyma is normal without contrast. No calcified gallstones.  Pancreas: Normal  Spleen: Normal  Adrenals/Urinary Tract: Adrenal glands are normal. Right kidney contain several small nonobstructing stones, the largest in the lower pole measuring 5 mm. On the left, there is hydroureteronephrosis and renal swelling. There is several nonobstructing stones in the left kidney, the largest in the lower pole measuring 6 x 7 mm. The ureter is dilated to the level of the lower pole of the kidney, where there is an obstructing stone measuring up to 12 mm in size. No stone seen distal to that. No stone in the bladder.  Stomach/Bowel: Stomach and small intestine are normal. No abnormal colon finding.  Vascular/Lymphatic: Aorta and IVC are normal.  No adenopathy.  Reproductive: No pelvic mass.  Bilateral fallopian tube wires.  Other: No free fluid or air.  Musculoskeletal: Ordinary lower lumbar degenerative changes. Old superior endplate Schmorl's node at L1.  IMPRESSION: 1. Hydroureteronephrosis on the left secondary to a 12 mm stone in the proximal ureter at the level of the lower pole of the left kidney. 2. Several nonobstructing stones in both kidneys, the largest in the lower pole of the left  kidney measuring 6 x 7 mm.   Electronically Signed By: Bettylou Brunner M.D. On: 08/16/2022 16:38   Assessment & Plan:    1. Kidney stone (Primary) Dietary handout given Followup 6 months with KUB - Urinalysis, Routine w reflex microscopic   No follow-ups on file.  Johnie Nailer, MD  [facility] [place]

## 2023-07-03 ENCOUNTER — Encounter: Payer: Self-pay | Admitting: Urology

## 2023-07-03 NOTE — Patient Instructions (Signed)

## 2023-07-12 LAB — STONE ANALYSIS
Calcium Oxalate Dihydrate: 70 %
Calcium Oxalate Monohydrate: 20 %
Calcium Phosphate (Hydroxyl): 10 %
Weight Calculi: 192 mg

## 2023-08-15 ENCOUNTER — Other Ambulatory Visit (HOSPITAL_COMMUNITY): Payer: Self-pay | Admitting: Obstetrics and Gynecology

## 2023-08-15 DIAGNOSIS — Z1231 Encounter for screening mammogram for malignant neoplasm of breast: Secondary | ICD-10-CM

## 2023-09-17 ENCOUNTER — Ambulatory Visit (HOSPITAL_COMMUNITY)
Admission: RE | Admit: 2023-09-17 | Discharge: 2023-09-17 | Disposition: A | Discharge status: Home / Self Care | Source: Ambulatory Visit | Attending: Obstetrics and Gynecology | Admitting: Obstetrics and Gynecology

## 2023-09-17 DIAGNOSIS — Z1231 Encounter for screening mammogram for malignant neoplasm of breast: Secondary | ICD-10-CM | POA: Insufficient documentation

## 2023-09-19 ENCOUNTER — Encounter (HOSPITAL_COMMUNITY): Payer: Self-pay | Admitting: Obstetrics and Gynecology

## 2023-09-20 ENCOUNTER — Other Ambulatory Visit (HOSPITAL_COMMUNITY): Payer: Self-pay | Admitting: Obstetrics and Gynecology

## 2023-09-20 DIAGNOSIS — R928 Other abnormal and inconclusive findings on diagnostic imaging of breast: Secondary | ICD-10-CM

## 2023-09-21 ENCOUNTER — Other Ambulatory Visit (HOSPITAL_COMMUNITY): Payer: Self-pay | Admitting: Internal Medicine

## 2023-09-21 DIAGNOSIS — R918 Other nonspecific abnormal finding of lung field: Secondary | ICD-10-CM

## 2023-10-02 ENCOUNTER — Ambulatory Visit (HOSPITAL_COMMUNITY)
Admission: RE | Admit: 2023-10-02 | Discharge: 2023-10-02 | Disposition: A | Discharge status: Home / Self Care | Source: Ambulatory Visit | Attending: Internal Medicine | Admitting: Internal Medicine

## 2023-10-02 DIAGNOSIS — R918 Other nonspecific abnormal finding of lung field: Secondary | ICD-10-CM | POA: Insufficient documentation

## 2023-10-09 ENCOUNTER — Encounter (HOSPITAL_COMMUNITY): Payer: Self-pay

## 2023-10-09 ENCOUNTER — Ambulatory Visit (HOSPITAL_COMMUNITY)
Admission: RE | Admit: 2023-10-09 | Discharge: 2023-10-09 | Disposition: A | Discharge status: Home / Self Care | Source: Ambulatory Visit | Attending: Obstetrics and Gynecology | Admitting: Obstetrics and Gynecology

## 2023-10-09 DIAGNOSIS — R928 Other abnormal and inconclusive findings on diagnostic imaging of breast: Secondary | ICD-10-CM | POA: Diagnosis present

## 2023-12-28 ENCOUNTER — Ambulatory Visit: Admitting: Urology

## 2023-12-28 ENCOUNTER — Encounter: Payer: Self-pay | Admitting: Urology

## 2023-12-28 ENCOUNTER — Ambulatory Visit (HOSPITAL_COMMUNITY)
Admission: RE | Admit: 2023-12-28 | Discharge: 2023-12-28 | Disposition: A | Discharge status: Home / Self Care | Source: Ambulatory Visit | Attending: Urology | Admitting: Urology

## 2023-12-28 VITALS — BP 121/81 | HR 89

## 2023-12-28 DIAGNOSIS — N200 Calculus of kidney: Secondary | ICD-10-CM

## 2023-12-28 LAB — URINALYSIS, ROUTINE W REFLEX MICROSCOPIC
Bilirubin, UA: NEGATIVE
Glucose, UA: NEGATIVE
Ketones, UA: NEGATIVE
Nitrite, UA: NEGATIVE
Protein,UA: NEGATIVE
Specific Gravity, UA: 1.01 (ref 1.005–1.030)
Urobilinogen, Ur: 0.2 mg/dL (ref 0.2–1.0)
pH, UA: 7 (ref 5.0–7.5)

## 2023-12-28 LAB — MICROSCOPIC EXAMINATION: Bacteria, UA: NONE SEEN

## 2023-12-28 NOTE — Progress Notes (Signed)
 12/28/2023 10:09 AM   Tracy Serrano 24-May-1965 [id number]  Referring provider: Shona Norleen PEDLAR, MD [address] [place],  [place] [address]  Followup nephrolithiasis   HPI: Ms Tracy Serrano is a 58yo here for followup for nephrolithiasis. No stone events since last visit. KUb from today shows bilateral lower pole calculi. No worsening LUTS   PMH: Past Medical History:  Diagnosis Date   History of kidney stones    Renal disorder    kideny stones   Weakness of both upper extremities    ALS    Surgical History: Past Surgical History:  Procedure Laterality Date   basket recovery     CYSTOSCOPY/RETROGRADE/URETEROSCOPY/STONE EXTRACTION WITH BASKET     2011   EXTRACORPOREAL SHOCK WAVE LITHOTRIPSY Right 12/21/2016   Procedure: RIGHT EXTRACORPOREAL SHOCK WAVE LITHOTRIPSY (ESWL);  Surgeon: Tracy Belvie CROME, MD;  Location: [facility];  Service: Urology;  Laterality: Right;   EXTRACORPOREAL SHOCK WAVE LITHOTRIPSY Left 08/22/2022   Procedure: EXTRACORPOREAL SHOCK WAVE LITHOTRIPSY (ESWL);  Surgeon: Tracy Belvie CROME, MD;  Location: [facility];  Service: Urology;  Laterality: Left;   EXTRACORPOREAL SHOCK WAVE LITHOTRIPSY Left 06/05/2023   Procedure: LITHOTRIPSY, ESWL;  Surgeon: Tracy Senior, MD;  Location: [facility];  Service: Urology;  Laterality: Left;   OTHER SURGICAL HISTORY     essure    Home Medications:  Allergies as of 12/28/2023       Reactions   Other Cough, Itching, Other (See Comments), Hives, Nausea Only, Rash, Swelling   Other Reaction(s): facial swelling, respiratory distress   Tree Extract Hives, Itching, Other (See Comments), Rash, Swelling        Medication List        Accurate as of December 28, 2023 10:09 AM. If you have any questions, ask your nurse or doctor.          cetirizine 10 MG tablet Commonly known as: ZYRTEC Take 10 mg by mouth daily.   EPINEPHrine  0.3 mg/0.3 mL Soaj injection Commonly known as: EPI-PEN Inject into the muscle.    multivitamin tablet Take 1 tablet by mouth daily.   riluzole 50 MG tablet Commonly known as: RILUTEK Take 1 tablet by mouth every 12 (twelve) hours.   tamsulosin  0.4 MG Caps capsule Commonly known as: FLOMAX  Take 1 capsule (0.4 mg total) by mouth daily after supper.        Allergies:  Allergies  Allergen Reactions   Other Cough, Itching, Other (See Comments), Hives, Nausea Only, Rash and Swelling    Other Reaction(s): facial swelling, respiratory distress   Tree Extract Hives, Itching, Other (See Comments), Rash and Swelling    Family History: Family History  Problem Relation Age of Onset   ALS Father    Kidney cancer Father    Kidney Stones Sister    Stroke Maternal Grandmother    Diabetes Maternal Grandfather     Social History:  reports that she has never smoked. She has never used smokeless tobacco. She reports that she does not currently use alcohol. She reports that she does not use drugs.  ROS: All other review of systems were reviewed and are negative except what is noted above in HPI  Physical Exam: BP 121/81   Pulse 89   LMP 01/01/2017   Constitutional:  Alert and oriented, No acute distress. HEENT:  AT, moist mucus membranes.  Trachea midline, no masses. Cardiovascular: No clubbing, cyanosis, or edema. Respiratory: Normal respiratory effort, no increased work of breathing. GI: Abdomen is soft, nontender,  nondistended, no abdominal masses GU: No CVA tenderness.  Lymph: No cervical or inguinal lymphadenopathy. Skin: No rashes, bruises or suspicious lesions. Neurologic: Grossly intact, no focal deficits, moving all 4 extremities. Psychiatric: Normal mood and affect.  Laboratory Data: Lab Results  Component Value Date   WBC 14.0 (H) 08/16/2022   HGB 12.3 08/16/2022   HCT 35.8 (L) 08/16/2022   MCV 94.0 08/16/2022   PLT 303 08/16/2022    Lab Results  Component Value Date   CREATININE 0.81 08/16/2022    No results found for: PSA  No  results found for: TESTOSTERONE  No results found for: HGBA1C  Urinalysis    Component Value Date/Time   COLORURINE YELLOW 08/16/2022 1447   APPEARANCEUR Clear 06/27/2023 1432   LABSPEC 1.018 08/16/2022 1447   PHURINE 8.0 08/16/2022 1447   GLUCOSEU Negative 06/27/2023 1432   HGBUR SMALL (A) 08/16/2022 1447   BILIRUBINUR Negative 06/27/2023 1432   KETONESUR 20 (A) 08/16/2022 1447   PROTEINUR Negative 06/27/2023 1432   PROTEINUR 30 (A) 08/16/2022 1447   UROBILINOGEN 0.2 03/30/2013 1911   NITRITE Negative 06/27/2023 1432   NITRITE NEGATIVE 08/16/2022 1447   LEUKOCYTESUR 2+ (A) 06/27/2023 1432   LEUKOCYTESUR TRACE (A) 08/16/2022 1447    Lab Results  Component Value Date   LABMICR See below: 06/27/2023   WBCUA 6-10 (A) 06/27/2023   LABEPIT 0-10 06/27/2023   BACTERIA Few (A) 06/27/2023    Pertinent Imaging: KUb today: Images reviewed and discussed with the patient  Results for orders placed during the hospital encounter of 06/27/23  DG Abd 1 View  Narrative CLINICAL DATA:  Kidney stone. Patient reports bilateral stones, recent lithotripsy.  EXAM: ABDOMEN - 1 VIEW  COMPARISON:  Most recent radiograph 06/05/2023  FINDINGS: 8-9 mm calcification projects over the lower pole of the left kidney may be an additional interpolar renal stone on the left. 8 mm calcification projects over the lower pole of the right kidney. Stable distribution of pelvic phleboliths. No bowel dilatation or evidence of obstruction. Moderate colonic stool burden. Bilateral tubal occlusion device.  IMPRESSION: Bilateral renal stones measuring up to 8-9 mm.   Electronically Signed By: Tracy Serrano M.D. On: 06/28/2023 11:02  Results for orders placed during the hospital encounter of 01/31/10  US  VenoUS Imaging Bilateral  Narrative Clinical Data: Left leg swelling  BILATERAL LOWER EXTREMITY VENOUS DUPLEX ULTRASOUND  Technique:  Gray-scale sonography with graded compression, as  well as color Doppler and duplex ultrasound, were performed to evaluate the deep venous system of both lower extremities from the level of the common femoral vein through the popliteal and proximal calf veins.  Spectral Doppler was utilized to evaluate flow at rest and with distal augmentation maneuvers.  Comparison:  None.  Findings:  Normal compressibility of bilateral common femoral, superficial femoral, and popliteal veins is demonstrated, as well as the visualized proximal calf veins.  No filling defects to suggest DVT on grayscale or color Doppler imaging.  Doppler waveforms show normal direction of venous flow, normal respiratory phasicity and response to augmentation.  IMPRESSION: No evidence of bilateral lower extremity deep vein thrombosis.  Provider: Charlie Baston  No results found for this or any previous visit.  No results found for this or any previous visit.  Results for orders placed during the hospital encounter of 11/19/15  US  Renal  Narrative CLINICAL DATA:  Follow-up of kidney stones  EXAM: RENAL / URINARY TRACT ULTRASOUND COMPLETE  COMPARISON:  Renal ultrasound of September 10, 2015 as well  as KUB of the same day.  FINDINGS: Right Kidney:  Length: 11.2 cm. The renal cortical echotexture remains lower than that of the adjacent liver. There are 2 echogenic shadowing stones in the lower pole. The largest measures 1.6 cm. The smaller stone measures approximately 0.5 cm. There is no hydronephrosis.  Left Kidney:  Length: 12.1 cm. The renal cortical echotexture is normal. No echogenic shadowing stones are observed. There is no hydronephrosis.  Bladder:  Appears normal for degree of bladder distention.  IMPRESSION: At least 2 nonobstructing stones are present in the lower pole of the right kidney. There is no hydronephrosis. No stones are observed on the left.   Electronically Signed By: David  Jordan M.D. On: 11/19/2015 15:00  No results found  for this or any previous visit.  No results found for this or any previous visit.  Results for orders placed during the hospital encounter of 08/16/22  CT Renal Stone Study  Narrative CLINICAL DATA:  Abdominal and flank pain on the left. Stone disease suspected.  EXAM: CT ABDOMEN AND PELVIS WITHOUT CONTRAST  TECHNIQUE: Multidetector CT imaging of the abdomen and pelvis was performed following the standard protocol without IV contrast.  RADIATION DOSE REDUCTION: This exam was performed according to the departmental dose-optimization program which includes automated exposure control, adjustment of the mA and/or kV according to patient size and/or use of iterative reconstruction technique.  COMPARISON:  Ultrasound 11/19/2015.  CT 06/19/2010.  FINDINGS: Lower chest: Lung bases are clear.  No pleural or pericardial fluid.  Hepatobiliary: Liver parenchyma is normal without contrast. No calcified gallstones.  Pancreas: Normal  Spleen: Normal  Adrenals/Urinary Tract: Adrenal glands are normal. Right kidney contain several small nonobstructing stones, the largest in the lower pole measuring 5 mm. On the left, there is hydroureteronephrosis and renal swelling. There is several nonobstructing stones in the left kidney, the largest in the lower pole measuring 6 x 7 mm. The ureter is dilated to the level of the lower pole of the kidney, where there is an obstructing stone measuring up to 12 mm in size. No stone seen distal to that. No stone in the bladder.  Stomach/Bowel: Stomach and small intestine are normal. No abnormal colon finding.  Vascular/Lymphatic: Aorta and IVC are normal.  No adenopathy.  Reproductive: No pelvic mass.  Bilateral fallopian tube wires.  Other: No free fluid or air.  Musculoskeletal: Ordinary lower lumbar degenerative changes. Old superior endplate Schmorl's node at L1.  IMPRESSION: 1. Hydroureteronephrosis on the left secondary to a 12 mm stone  in the proximal ureter at the level of the lower pole of the left kidney. 2. Several nonobstructing stones in both kidneys, the largest in the lower pole of the left kidney measuring 6 x 7 mm.   Electronically Signed By: Oneil Officer M.D. On: 08/16/2022 16:38   Assessment & Plan:    1. Kidney stone (Primary) -We discussed the management of kidney stones. These options include observation, ureteroscopy, shockwave lithotripsy (ESWL) and percutaneous nephrolithotomy (PCNL). We discussed which options are relevant to the patient's stone(s). We discussed the natural history of kidney stones as well as the complications of untreated stones and the impact on quality of life without treatment as well as with each of the above listed treatments. We also discussed the efficacy of each treatment in its ability to clear the stone burden. With any of these management options I discussed the signs and symptoms of infection and the need for emergent treatment should these be experienced.  For each option we discussed the ability of each procedure to clear the patient of their stone burden.   For observation I described the risks which include but are not limited to silent renal damage, life-threatening infection, need for emergent surgery, failure to pass stone and pain.   For ureteroscopy I described the risks which include bleeding, infection, damage to contiguous structures, positioning injury, ureteral stricture, ureteral avulsion, ureteral injury, need for prolonged ureteral stent, inability to perform ureteroscopy, need for an interval procedure, inability to clear stone burden, stent discomfort/pain, heart attack, stroke, pulmonary embolus and the inherent risks with general anesthesia.   For shockwave lithotripsy I described the risks which include arrhythmia, kidney contusion, kidney hemorrhage, need for transfusion, pain, inability to adequately break up stone, inability to pass stone fragments,  Steinstrasse, infection associated with obstructing stones, need for alternate surgical procedure, need for repeat shockwave lithotripsy, MI, CVA, PE and the inherent risks with anesthesia/conscious sedation.   For PCNL I described the risks including positioning injury, pneumothorax, hydrothorax, need for chest tube, inability to clear stone burden, renal laceration, arterial venous fistula or malformation, need for embolization of kidney, loss of kidney or renal function, need for repeat procedure, need for prolonged nephrostomy tube, ureteral avulsion, MI, CVA, PE and the inherent risks of general anesthesia.   - The patient would like to proceed with left ESWL - Urinalysis, Routine w reflex microscopic   No follow-ups on file.  Belvie Clara, MD  [facility] [place]

## 2023-12-28 NOTE — Patient Instructions (Signed)

## 2023-12-31 ENCOUNTER — Other Ambulatory Visit: Payer: Self-pay

## 2023-12-31 DIAGNOSIS — N200 Calculus of kidney: Secondary | ICD-10-CM

## 2024-01-16 ENCOUNTER — Other Ambulatory Visit: Payer: Self-pay

## 2024-01-16 ENCOUNTER — Telehealth: Payer: Self-pay

## 2024-01-16 ENCOUNTER — Other Ambulatory Visit

## 2024-01-16 DIAGNOSIS — N200 Calculus of kidney: Secondary | ICD-10-CM

## 2024-01-16 LAB — URINALYSIS, ROUTINE W REFLEX MICROSCOPIC
Bilirubin, UA: NEGATIVE
Glucose, UA: NEGATIVE
Ketones, UA: NEGATIVE
Nitrite, UA: NEGATIVE
Protein,UA: NEGATIVE
RBC, UA: NEGATIVE
Specific Gravity, UA: 1.01 (ref 1.005–1.030)
Urobilinogen, Ur: 0.2 mg/dL (ref 0.2–1.0)
pH, UA: 7 (ref 5.0–7.5)

## 2024-01-16 LAB — MICROSCOPIC EXAMINATION

## 2024-01-16 NOTE — Telephone Encounter (Signed)
 Patient presents today Pre-op  UA.  UA and Culture done today.  Dr. Sherrilee reviewed results and no treatment needed .  Patient aware of MD recommendations and that we will reach out with culture results.      [email or web address], CMA

## 2024-01-18 LAB — URINE CULTURE

## 2024-01-22 ENCOUNTER — Ambulatory Visit: Payer: Self-pay

## 2024-01-31 ENCOUNTER — Other Ambulatory Visit: Payer: Self-pay

## 2024-01-31 DIAGNOSIS — N200 Calculus of kidney: Secondary | ICD-10-CM

## 2024-01-31 MED ORDER — TAMSULOSIN HCL 0.4 MG PO CAPS: 0.4000 mg | ORAL_CAPSULE | Freq: Every day | ORAL | 0 refills | Status: DC

## 2024-02-07 ENCOUNTER — Encounter (HOSPITAL_COMMUNITY): Admission: RE | Admit: 2024-02-07 | Discharge: 2024-02-07 | Attending: Urology

## 2024-02-08 ENCOUNTER — Other Ambulatory Visit: Payer: Self-pay

## 2024-02-08 ENCOUNTER — Encounter (HOSPITAL_COMMUNITY): Payer: Self-pay

## 2024-02-08 NOTE — Pre-Procedure Instructions (Signed)
 Attempted pre-op phonecall. Left VM for her to call us  back.

## 2024-02-12 ENCOUNTER — Other Ambulatory Visit: Payer: Self-pay

## 2024-02-12 ENCOUNTER — Other Ambulatory Visit: Payer: Self-pay | Admitting: Urology

## 2024-02-12 ENCOUNTER — Ambulatory Visit (HOSPITAL_COMMUNITY)

## 2024-02-12 ENCOUNTER — Ambulatory Visit (HOSPITAL_COMMUNITY)
Admission: RE | Admit: 2024-02-12 | Discharge: 2024-02-12 | Disposition: A | Discharge status: Home / Self Care | Attending: Urology | Admitting: Urology

## 2024-02-12 ENCOUNTER — Encounter (HOSPITAL_COMMUNITY): Payer: Self-pay | Admitting: Urology

## 2024-02-12 DIAGNOSIS — Z87442 Personal history of urinary calculi: Secondary | ICD-10-CM | POA: Insufficient documentation

## 2024-02-12 DIAGNOSIS — N200 Calculus of kidney: Secondary | ICD-10-CM | POA: Diagnosis present

## 2024-02-12 DIAGNOSIS — I498 Other specified cardiac arrhythmias: Secondary | ICD-10-CM

## 2024-02-12 HISTORY — PX: EXTRACORPOREAL SHOCK WAVE LITHOTRIPSY: SHX1557

## 2024-02-12 SURGERY — LITHOTRIPSY, ESWL
Anesthesia: LOCAL | Laterality: Left

## 2024-02-12 MED ORDER — DIAZEPAM 5 MG PO TABS
10.0000 mg | ORAL_TABLET | Freq: Once | ORAL | Status: AC
  Administered 2024-02-12: 10 mg via ORAL
  Filled 2024-02-12: qty 2

## 2024-02-12 MED ORDER — ONDANSETRON HCL 4 MG PO TABS: 4.0000 mg | ORAL_TABLET | Freq: Every day | ORAL | 1 refills | Status: AC | PRN

## 2024-02-12 MED ORDER — OXYCODONE-ACETAMINOPHEN 5-325 MG PO TABS: 1.0000 | ORAL_TABLET | ORAL | 0 refills | Status: AC | PRN

## 2024-02-12 MED ORDER — TAMSULOSIN HCL 0.4 MG PO CAPS: 0.4000 mg | ORAL_CAPSULE | Freq: Every day | ORAL | 0 refills | Status: AC

## 2024-02-12 MED ORDER — DIPHENHYDRAMINE HCL 25 MG PO CAPS
25.0000 mg | ORAL_CAPSULE | ORAL | Status: AC
  Administered 2024-02-12: 25 mg via ORAL
  Filled 2024-02-12: qty 1

## 2024-02-12 NOTE — Progress Notes (Signed)
 Slight redness observed to left flank area at site of lithotripsy.  No c/o pain or discomfort at present time. Dr. Sherrilee into see pt in post op to discuss procedure and that he will refer her to a cardiologist d/t cardiac arrhythmia during procedure today.  Per Dr. Sherrilee, pt not required to void prior to being discharged.

## 2024-02-12 NOTE — H&P (Signed)
 HPI: Ms Tracy Serrano is a 58yo here for followup for nephrolithiasis. No stone events since last visit. KUb from today shows bilateral lower pole calculi. No worsening LUTS     PMH:     Past Medical History:  Diagnosis Date   History of kidney stones     Renal disorder      kideny stones   Weakness of both upper extremities      ALS          Surgical History:      Past Surgical History:  Procedure Laterality Date   basket recovery       CYSTOSCOPY/RETROGRADE/URETEROSCOPY/STONE EXTRACTION WITH BASKET        2011   EXTRACORPOREAL SHOCK WAVE LITHOTRIPSY Right 12/21/2016    Procedure: RIGHT EXTRACORPOREAL SHOCK WAVE LITHOTRIPSY (ESWL);  Surgeon: Sherrilee Belvie CROME, MD;  Location: [facility];  Service: Urology;  Laterality: Right;   EXTRACORPOREAL SHOCK WAVE LITHOTRIPSY Left 08/22/2022    Procedure: EXTRACORPOREAL SHOCK WAVE LITHOTRIPSY (ESWL);  Surgeon: Sherrilee Belvie CROME, MD;  Location: [facility];  Service: Urology;  Laterality: Left;   EXTRACORPOREAL SHOCK WAVE LITHOTRIPSY Left 06/05/2023    Procedure: LITHOTRIPSY, ESWL;  Surgeon: Matilda Senior, MD;  Location: [facility];  Service: Urology;  Laterality: Left;   OTHER SURGICAL HISTORY        essure          Home Medications:  Allergies as of 12/28/2023         Reactions    Other Cough, Itching, Other (See Comments), Hives, Nausea Only, Rash, Swelling    Other Reaction(s): facial swelling, respiratory distress    Tree Extract Hives, Itching, Other (See Comments), Rash, Swelling            Medication List           Accurate as of December 28, 2023 10:09 AM. If you have any questions, ask your nurse or doctor.              cetirizine 10 MG tablet Commonly known as: ZYRTEC Take 10 mg by mouth daily.    EPINEPHrine  0.3 mg/0.3 mL Soaj injection Commonly known as: EPI-PEN Inject into the muscle.    multivitamin tablet Take 1 tablet by mouth daily.    riluzole 50 MG tablet Commonly known as: RILUTEK Take 1 tablet by  mouth every 12 (twelve) hours.    tamsulosin  0.4 MG Caps capsule Commonly known as: FLOMAX  Take 1 capsule (0.4 mg total) by mouth daily after supper.             Allergies:  Allergies       Allergies  Allergen Reactions   Other Cough, Itching, Other (See Comments), Hives, Nausea Only, Rash and Swelling      Other Reaction(s): facial swelling, respiratory distress   Tree Extract Hives, Itching, Other (See Comments), Rash and Swelling        Family History:      Family History  Problem Relation Age of Onset   ALS Father     Kidney cancer Father     Kidney Stones Sister     Stroke Maternal Grandmother     Diabetes Maternal Grandfather            Social History:  reports that she has never smoked. She has never used smokeless tobacco. She reports that she does not currently use alcohol. She reports that she does not use drugs.   ROS: All other review of systems were reviewed and are  negative except what is noted above in HPI   Physical Exam: BP 121/81   Pulse 89   LMP 01/01/2017   Constitutional:  Alert and oriented, No acute distress. HEENT: [place] AT, moist mucus membranes.  Trachea midline, no masses. Cardiovascular: No clubbing, cyanosis, or edema. Respiratory: Normal respiratory effort, no increased work of breathing. GI: Abdomen is soft, nontender, nondistended, no abdominal masses GU: No CVA tenderness.  Lymph: No cervical or inguinal lymphadenopathy. Skin: No rashes, bruises or suspicious lesions. Neurologic: Grossly intact, no focal deficits, moving all 4 extremities. Psychiatric: Normal mood and affect.   Laboratory Data: Recent Labs       Lab Results  Component Value Date    WBC 14.0 (H) 08/16/2022    HGB 12.3 08/16/2022    HCT 35.8 (L) 08/16/2022    MCV 94.0 08/16/2022    PLT 303 08/16/2022        Recent Labs       Lab Results  Component Value Date    CREATININE 0.81 08/16/2022        Recent Labs  No results found for: PSA      Recent Labs  No results found for: TESTOSTERONE     Recent Labs  No results found for: HGBA1C     Urinalysis Labs (Brief)          Component Value Date/Time    COLORURINE YELLOW 08/16/2022 1447    APPEARANCEUR Clear 06/27/2023 1432    LABSPEC 1.018 08/16/2022 1447    PHURINE 8.0 08/16/2022 1447    GLUCOSEU Negative 06/27/2023 1432    HGBUR SMALL (A) 08/16/2022 1447    BILIRUBINUR Negative 06/27/2023 1432    KETONESUR 20 (A) 08/16/2022 1447    PROTEINUR Negative 06/27/2023 1432    PROTEINUR 30 (A) 08/16/2022 1447    UROBILINOGEN 0.2 03/30/2013 1911    NITRITE Negative 06/27/2023 1432    NITRITE NEGATIVE 08/16/2022 1447    LEUKOCYTESUR 2+ (A) 06/27/2023 1432    LEUKOCYTESUR TRACE (A) 08/16/2022 1447        Recent Labs       Lab Results  Component Value Date    LABMICR See below: 06/27/2023    WBCUA 6-10 (A) 06/27/2023    LABEPIT 0-10 06/27/2023    BACTERIA Few (A) 06/27/2023        Pertinent Imaging: KUb today: Images reviewed and discussed with the patient  Results for orders placed during the hospital encounter of 06/27/23   DG Abd 1 View   Narrative CLINICAL DATA:  Kidney stone. Patient reports bilateral stones, recent lithotripsy.   EXAM: ABDOMEN - 1 VIEW   COMPARISON:  Most recent radiograph 06/05/2023   FINDINGS: 8-9 mm calcification projects over the lower pole of the left kidney may be an additional interpolar renal stone on the left. 8 mm calcification projects over the lower pole of the right kidney. Stable distribution of pelvic phleboliths. No bowel dilatation or evidence of obstruction. Moderate colonic stool burden. Bilateral tubal occlusion device.   IMPRESSION: Bilateral renal stones measuring up to 8-9 mm.     Electronically Signed By: Andrea Gasman M.D. On: 06/28/2023 11:02   Results for orders placed during the hospital encounter of 01/31/10   US  VenoUS Imaging Bilateral   Narrative Clinical Data: Left leg  swelling   BILATERAL LOWER EXTREMITY VENOUS DUPLEX ULTRASOUND   Technique:  Gray-scale sonography with graded compression, as well as color Doppler and duplex ultrasound, were performed to evaluate the deep venous system  of both lower extremities from the level of the common femoral vein through the popliteal and proximal calf veins.  Spectral Doppler was utilized to evaluate flow at rest and with distal augmentation maneuvers.   Comparison:  None.   Findings:  Normal compressibility of bilateral common femoral, superficial femoral, and popliteal veins is demonstrated, as well as the visualized proximal calf veins.  No filling defects to suggest DVT on grayscale or color Doppler imaging.  Doppler waveforms show normal direction of venous flow, normal respiratory phasicity and response to augmentation.   IMPRESSION: No evidence of bilateral lower extremity deep vein thrombosis.   Provider: Charlie Baston   No results found for this or any previous visit.   No results found for this or any previous visit.   Results for orders placed during the hospital encounter of 11/19/15   US  Renal   Narrative CLINICAL DATA:  Follow-up of kidney stones   EXAM: RENAL / URINARY TRACT ULTRASOUND COMPLETE   COMPARISON:  Renal ultrasound of September 10, 2015 as well as KUB of the same day.   FINDINGS: Right Kidney:   Length: 11.2 cm. The renal cortical echotexture remains lower than that of the adjacent liver. There are 2 echogenic shadowing stones in the lower pole. The largest measures 1.6 cm. The smaller stone measures approximately 0.5 cm. There is no hydronephrosis.   Left Kidney:   Length: 12.1 cm. The renal cortical echotexture is normal. No echogenic shadowing stones are observed. There is no hydronephrosis.   Bladder:   Appears normal for degree of bladder distention.   IMPRESSION: At least 2 nonobstructing stones are present in the lower pole of the right kidney. There is  no hydronephrosis. No stones are observed on the left.     Electronically Signed By: David  Jordan M.D. On: 11/19/2015 15:00   No results found for this or any previous visit.   No results found for this or any previous visit.   Results for orders placed during the hospital encounter of 08/16/22   CT Renal Stone Study   Narrative CLINICAL DATA:  Abdominal and flank pain on the left. Stone disease suspected.   EXAM: CT ABDOMEN AND PELVIS WITHOUT CONTRAST   TECHNIQUE: Multidetector CT imaging of the abdomen and pelvis was performed following the standard protocol without IV contrast.   RADIATION DOSE REDUCTION: This exam was performed according to the departmental dose-optimization program which includes automated exposure control, adjustment of the mA and/or kV according to patient size and/or use of iterative reconstruction technique.   COMPARISON:  Ultrasound 11/19/2015.  CT 06/19/2010.   FINDINGS: Lower chest: Lung bases are clear.  No pleural or pericardial fluid.   Hepatobiliary: Liver parenchyma is normal without contrast. No calcified gallstones.   Pancreas: Normal   Spleen: Normal   Adrenals/Urinary Tract: Adrenal glands are normal. Right kidney contain several small nonobstructing stones, the largest in the lower pole measuring 5 mm. On the left, there is hydroureteronephrosis and renal swelling. There is several nonobstructing stones in the left kidney, the largest in the lower pole measuring 6 x 7 mm. The ureter is dilated to the level of the lower pole of the kidney, where there is an obstructing stone measuring up to 12 mm in size. No stone seen distal to that. No stone in the bladder.   Stomach/Bowel: Stomach and small intestine are normal. No abnormal colon finding.   Vascular/Lymphatic: Aorta and IVC are normal.  No adenopathy.   Reproductive: No pelvic mass.  Bilateral fallopian tube wires.   Other: No free fluid or air.    Musculoskeletal: Ordinary lower lumbar degenerative changes. Old superior endplate Schmorl's node at L1.   IMPRESSION: 1. Hydroureteronephrosis on the left secondary to a 12 mm stone in the proximal ureter at the level of the lower pole of the left kidney. 2. Several nonobstructing stones in both kidneys, the largest in the lower pole of the left kidney measuring 6 x 7 mm.     Electronically Signed By: Oneil Officer M.D. On: 08/16/2022 16:38     Assessment & Plan:     1. Kidney stone (Primary) -We discussed the management of kidney stones. These options include observation, ureteroscopy, shockwave lithotripsy (ESWL) and percutaneous nephrolithotomy (PCNL). We discussed which options are relevant to the patient's stone(s). We discussed the natural history of kidney stones as well as the complications of untreated stones and the impact on quality of life without treatment as well as with each of the above listed treatments. We also discussed the efficacy of each treatment in its ability to clear the stone burden. With any of these management options I discussed the signs and symptoms of infection and the need for emergent treatment should these be experienced. For each option we discussed the ability of each procedure to clear the patient of their stone burden.   For observation I described the risks which include but are not limited to silent renal damage, life-threatening infection, need for emergent surgery, failure to pass stone and pain.   For ureteroscopy I described the risks which include bleeding, infection, damage to contiguous structures, positioning injury, ureteral stricture, ureteral avulsion, ureteral injury, need for prolonged ureteral stent, inability to perform ureteroscopy, need for an interval procedure, inability to clear stone burden, stent discomfort/pain, heart attack, stroke, pulmonary embolus and the inherent risks with general anesthesia.   For shockwave lithotripsy  I described the risks which include arrhythmia, kidney contusion, kidney hemorrhage, need for transfusion, pain, inability to adequately break up stone, inability to pass stone fragments, Steinstrasse, infection associated with obstructing stones, need for alternate surgical procedure, need for repeat shockwave lithotripsy, MI, CVA, PE and the inherent risks with anesthesia/conscious sedation.   For PCNL I described the risks including positioning injury, pneumothorax, hydrothorax, need for chest tube, inability to clear stone burden, renal laceration, arterial venous fistula or malformation, need for embolization of kidney, loss of kidney or renal function, need for repeat procedure, need for prolonged nephrostomy tube, ureteral avulsion, MI, CVA, PE and the inherent risks of general anesthesia.   - The patient would like to proceed with left ESWL

## 2024-02-12 NOTE — Discharge Instructions (Signed)
 Tracy Serrano

## 2024-02-13 ENCOUNTER — Encounter (HOSPITAL_COMMUNITY): Payer: Self-pay | Admitting: Urology

## 2024-02-25 ENCOUNTER — Telehealth: Payer: Self-pay | Admitting: Urology

## 2024-02-25 NOTE — Telephone Encounter (Signed)
 SABRA

## 2024-02-25 NOTE — Telephone Encounter (Signed)
 Message sent to MD to confirm.  Will call patient with MD response.

## 2024-02-25 NOTE — Telephone Encounter (Signed)
 Per Dr. Sherrilee she should continue her flomax  until she completes her rx and she does not need to continue to strain her urine.  Left a detail message on patients cell phone with MD instructions.  Call back number provided.

## 2024-03-05 ENCOUNTER — Ambulatory Visit (HOSPITAL_COMMUNITY)
Admission: RE | Admit: 2024-03-05 | Discharge: 2024-03-05 | Disposition: A | Discharge status: Home / Self Care | Source: Ambulatory Visit | Attending: Urology | Admitting: Urology

## 2024-03-05 DIAGNOSIS — N200 Calculus of kidney: Secondary | ICD-10-CM | POA: Diagnosis present

## 2024-04-09 ENCOUNTER — Ambulatory Visit: Admitting: Cardiology
